# Patient Record
Sex: Female | Born: 1985 | Race: Black or African American | Hispanic: No | Marital: Single | Smoking: Never smoker
Health system: Southern US, Community
[De-identification: ages and names within clinical notes are randomized; demographics above are authoritative.]

## PROBLEM LIST (undated history)

## (undated) DIAGNOSIS — IMO0002 Reserved for concepts with insufficient information to code with codable children: Secondary | ICD-10-CM

## (undated) DIAGNOSIS — E559 Vitamin D deficiency, unspecified: Secondary | ICD-10-CM

## (undated) DIAGNOSIS — S0300XA Dislocation of jaw, unspecified side, initial encounter: Secondary | ICD-10-CM

## (undated) DIAGNOSIS — A630 Anogenital (venereal) warts: Secondary | ICD-10-CM

## (undated) DIAGNOSIS — G43909 Migraine, unspecified, not intractable, without status migrainosus: Secondary | ICD-10-CM

## (undated) HISTORY — DX: Migraine, unspecified, not intractable, without status migrainosus: G43.909

## (undated) HISTORY — DX: Reserved for concepts with insufficient information to code with codable children: IMO0002

## (undated) HISTORY — DX: Vitamin D deficiency, unspecified: E55.9

## (undated) HISTORY — DX: Anogenital (venereal) warts: A63.0

## (undated) HISTORY — PX: OTHER SURGICAL HISTORY: SHX169

## (undated) HISTORY — DX: Dislocation of jaw, unspecified side, initial encounter: S03.00XA

---
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
## 2006-02-16 ENCOUNTER — Emergency Department: Payer: Self-pay | Admitting: Emergency Medicine

## 2006-02-17 ENCOUNTER — Other Ambulatory Visit: Payer: Self-pay

## 2006-07-01 IMAGING — CR DG HAND COMPLETE 3+V*L*
1 series · 3 of 3 positions shown · non-contrast
Comparison: none

REASON FOR EXAM: Pain
COMMENTS:

PROCEDURE:     DXR - DXR HAND LT COMPLETE  W/OBLIQUES  - September 30, 2004 [DATE]
RESULT:     Multiple views of the LEFT hand show no evidence of fracture,
foreign body or soft tissue swelling.

[Series 1: view not recorded · 0.17mm/px · 3 of 3 slices shown]
[im 1/3]
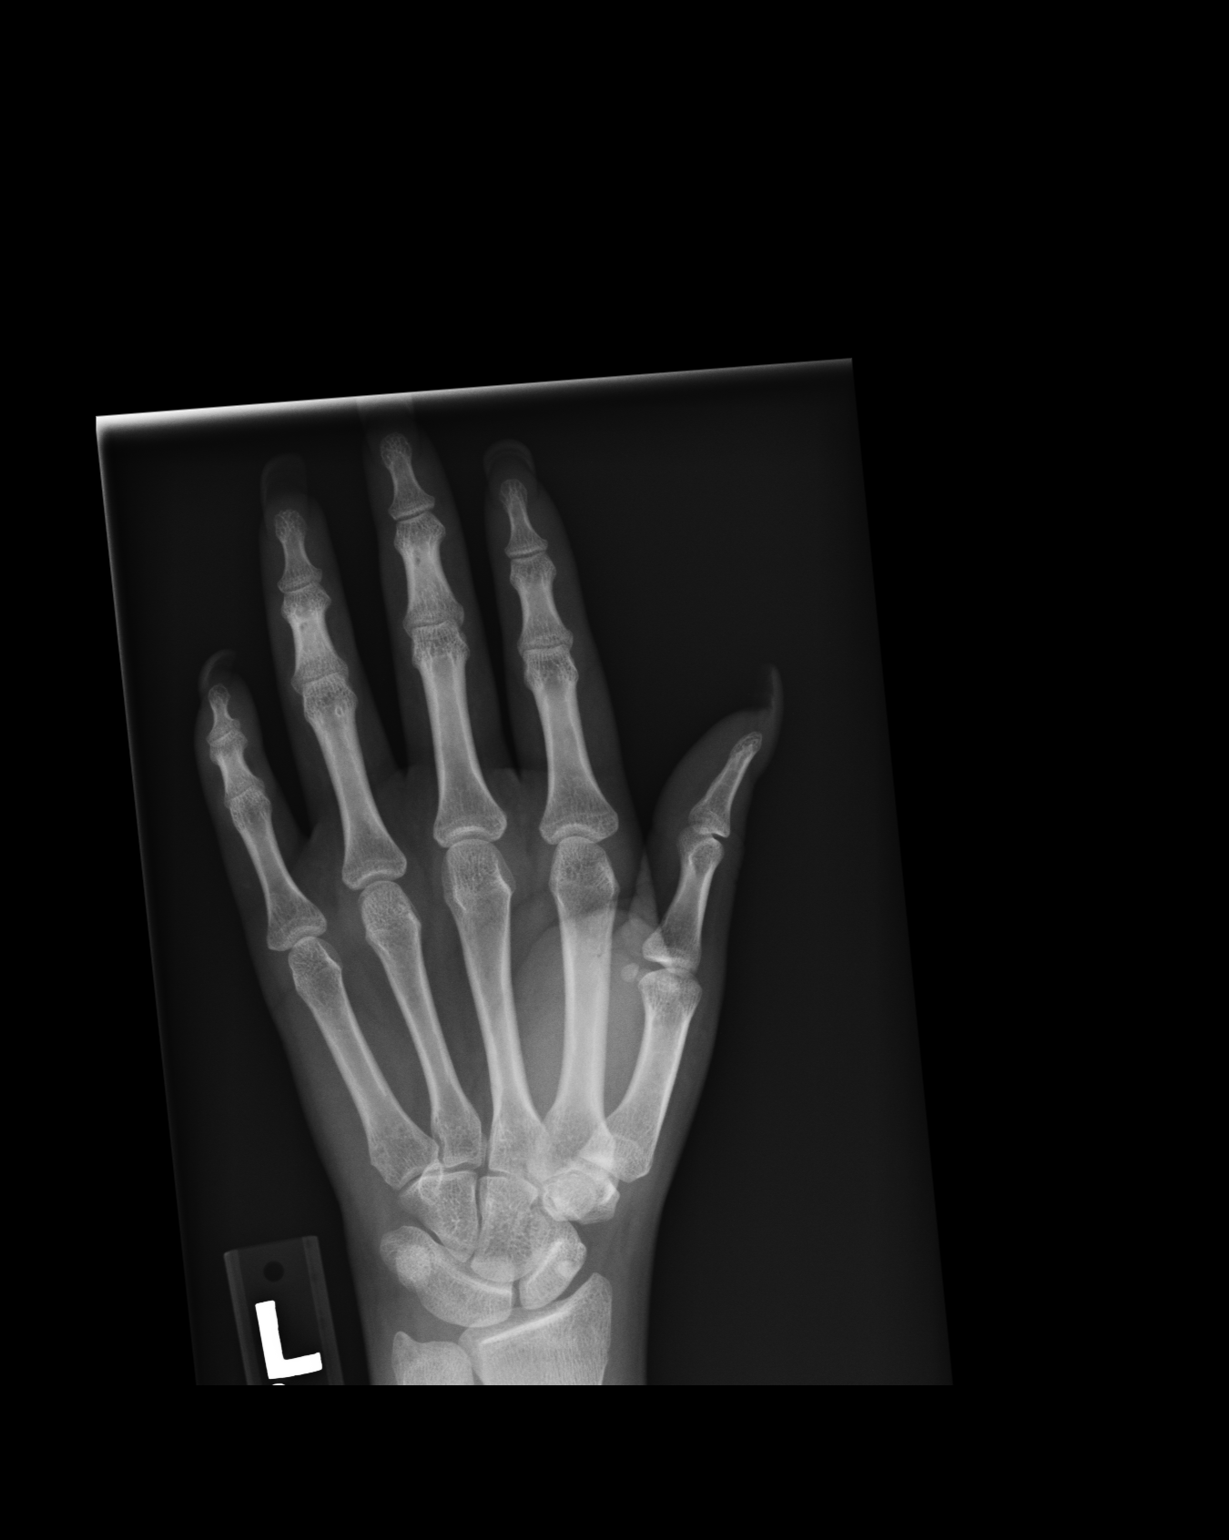
[im 2/3]
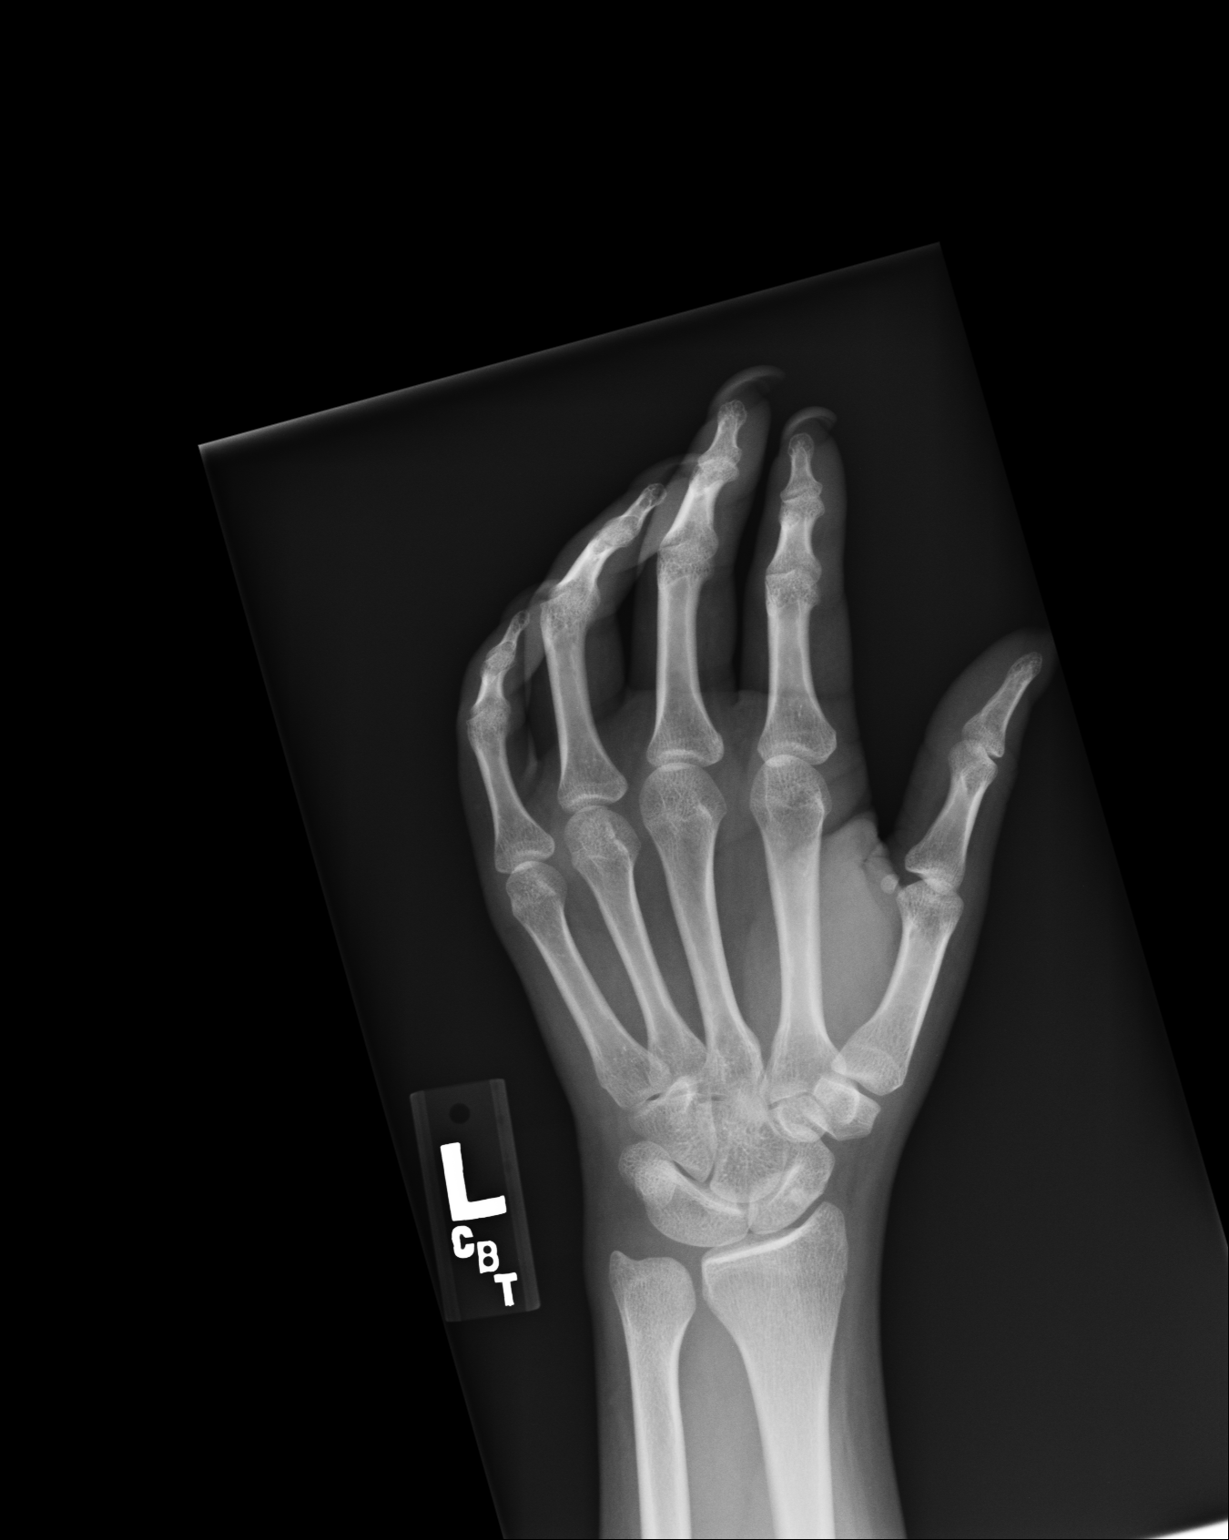
[im 3/3]
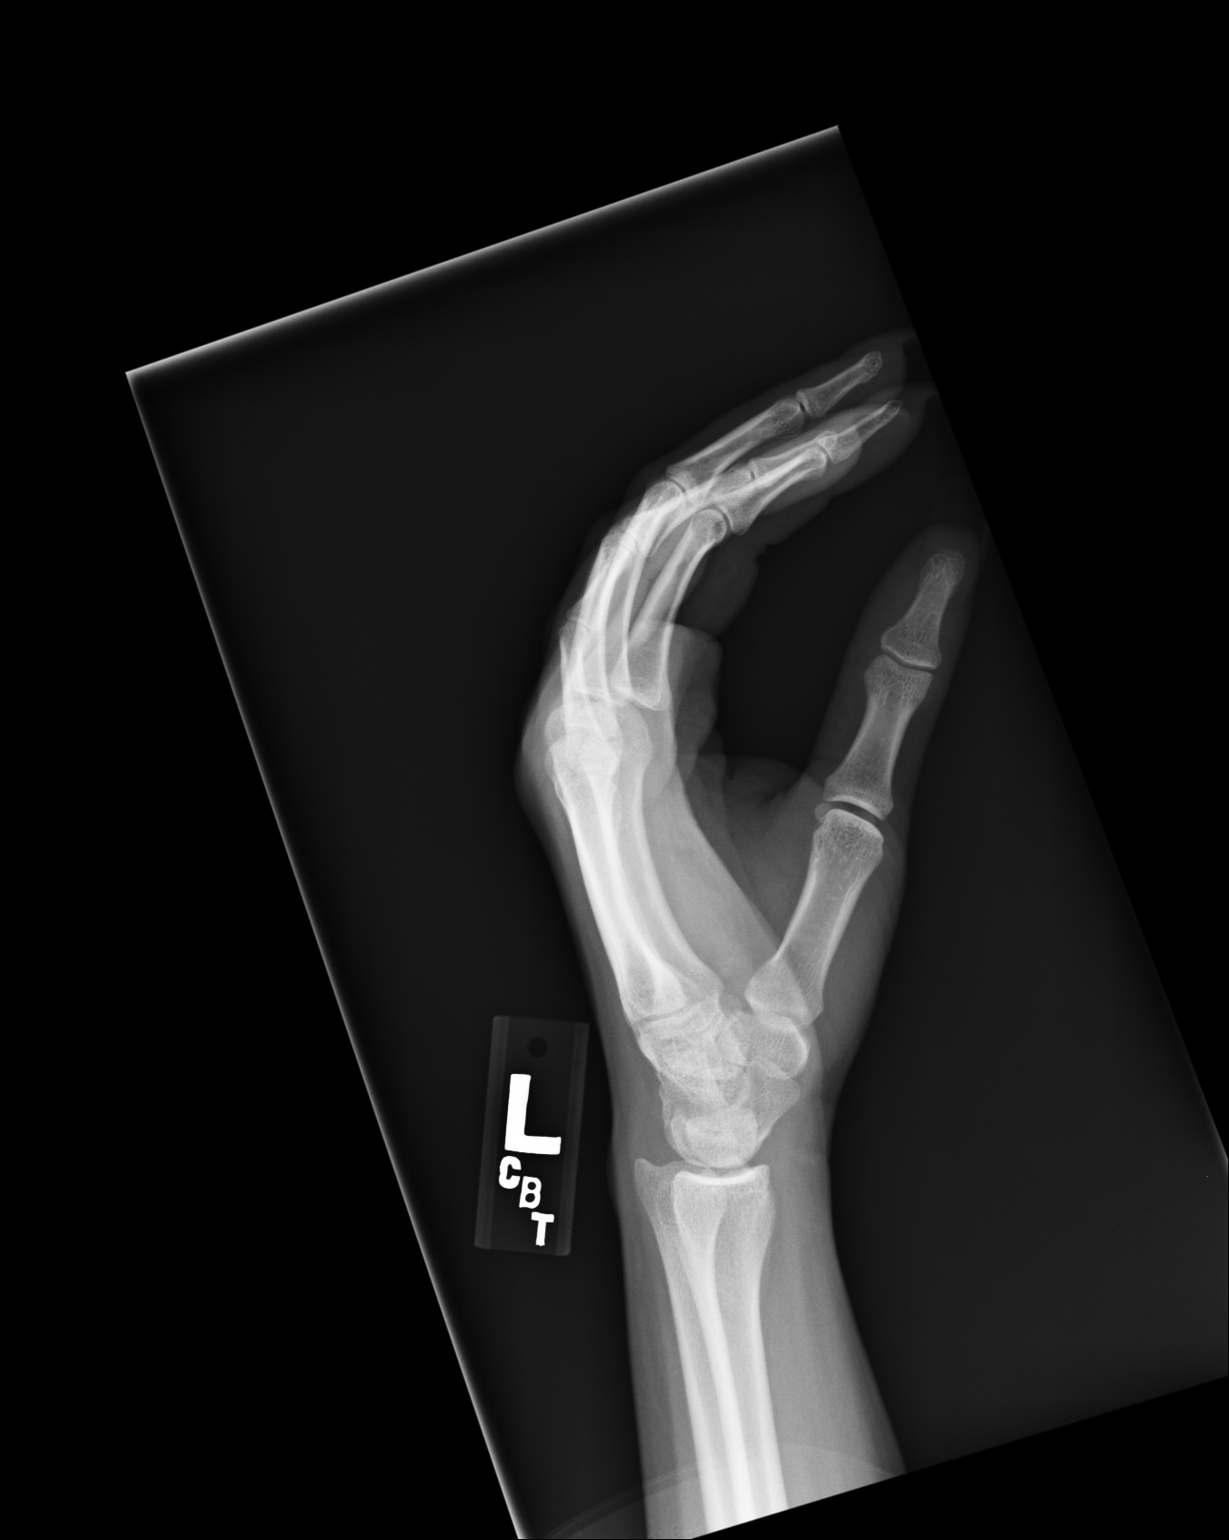

[3 of 3 positions shown; findings below may reference images not displayed]

IMPRESSION: Normal LEFT hand.

## 2007-10-20 ENCOUNTER — Emergency Department: Payer: Self-pay | Admitting: Emergency Medicine

## 2007-10-20 ENCOUNTER — Other Ambulatory Visit: Payer: Self-pay

## 2009-07-14 ENCOUNTER — Ambulatory Visit: Payer: Self-pay | Admitting: Family Medicine

## 2009-07-24 ENCOUNTER — Ambulatory Visit: Payer: Self-pay | Admitting: Psychiatry

## 2011-04-14 IMAGING — CT CT HEAD WITHOUT AND WITH CONTRAST
1 of 2 series · 13 of 30 positions shown, 17 images · non-contrast
Comparison: none

REASON FOR EXAM: rule out anerysm l side weakness ha
COMMENTS:

[Series 2: soft tissue wo · axial · 0.39mm/px · z∈[+8,+123]mm · 13 of 27 slices shown, 17 images]
[im 2/27  brain]
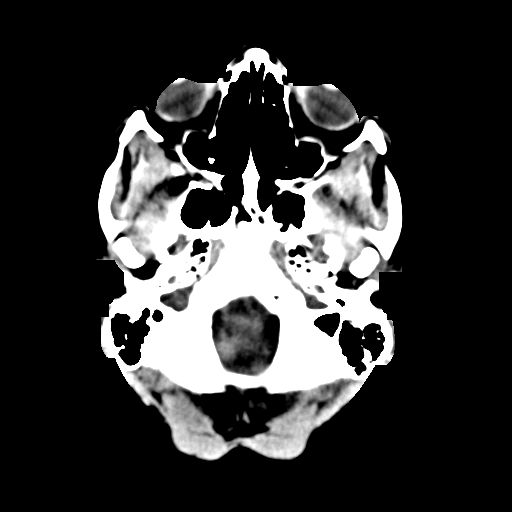
[im 2/27  bone]
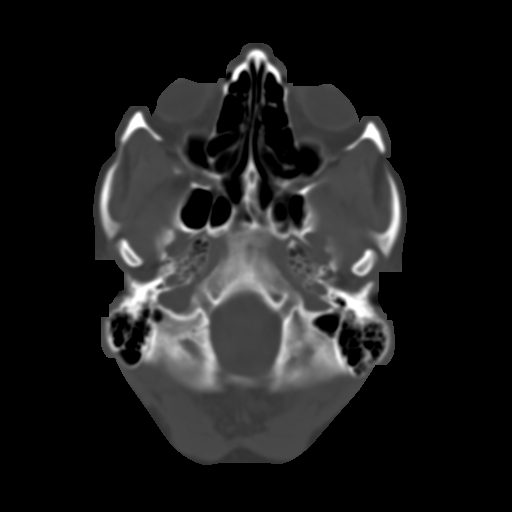
[im 4/27  brain]
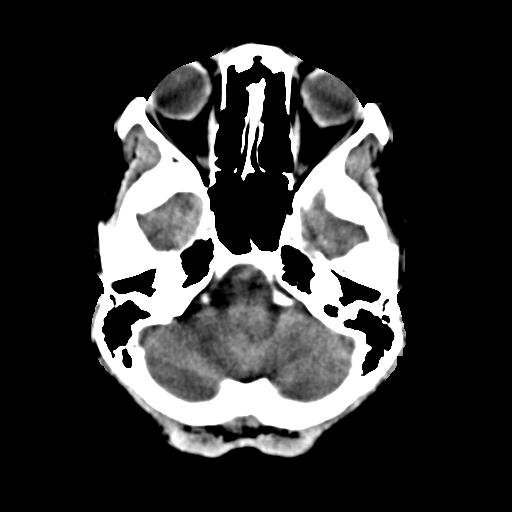
[im 6/27  brain]
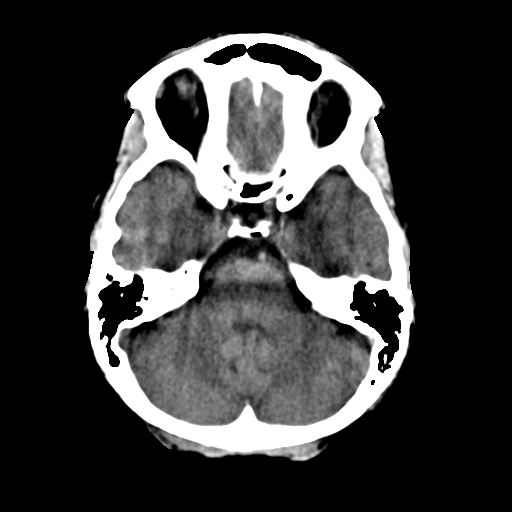
[im 8/27  brain]
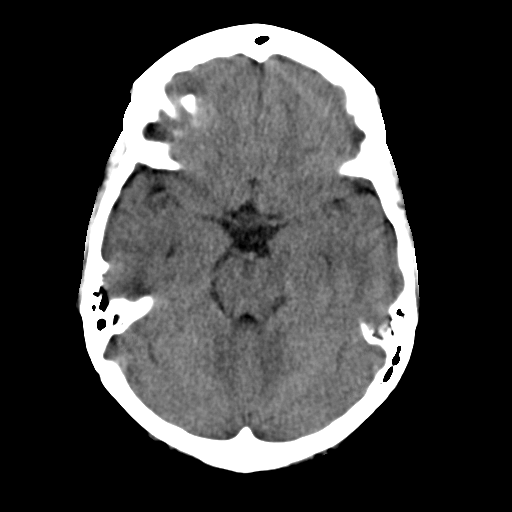
[im 10/27  brain]
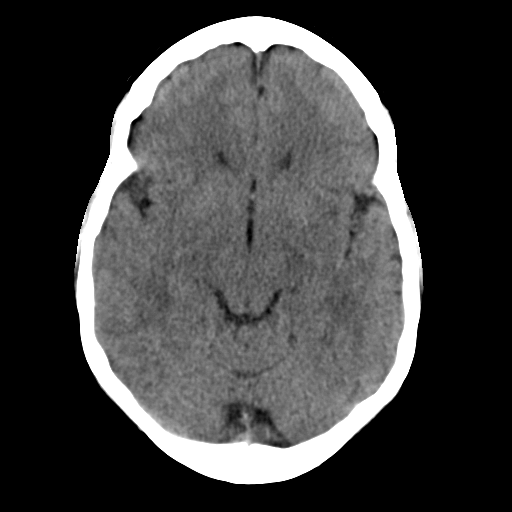
[im 10/27  bone]
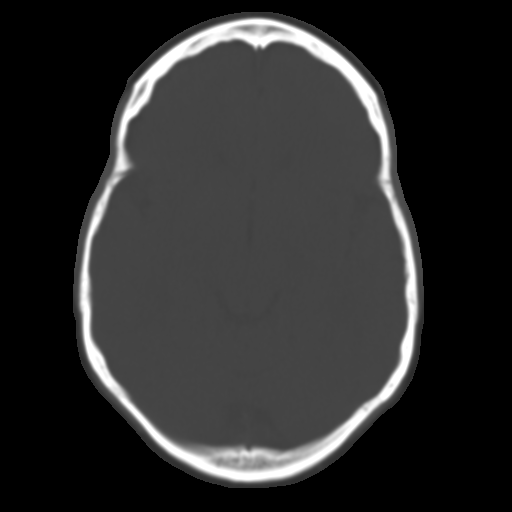
[im 12/27  brain]
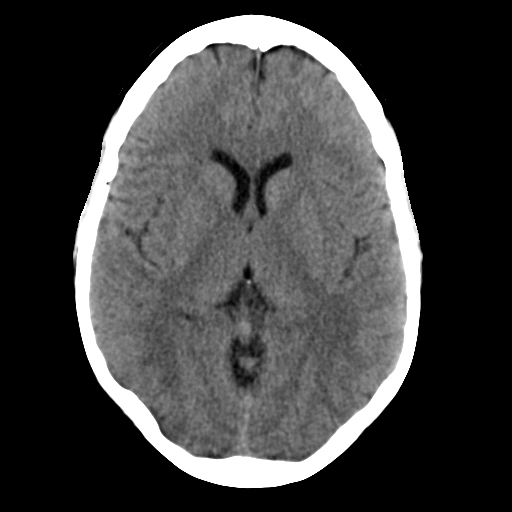
[im 14/27  brain]
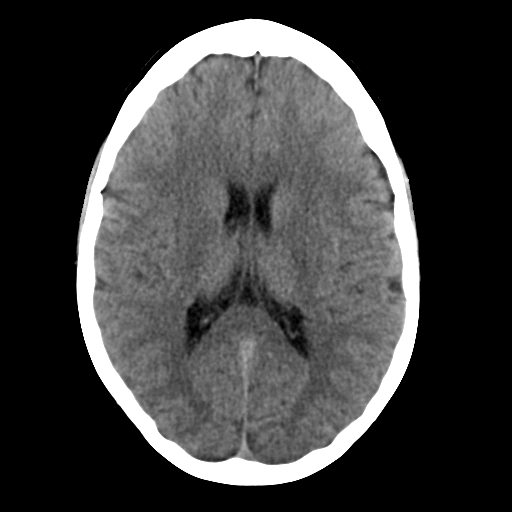
[im 15/27  brain]
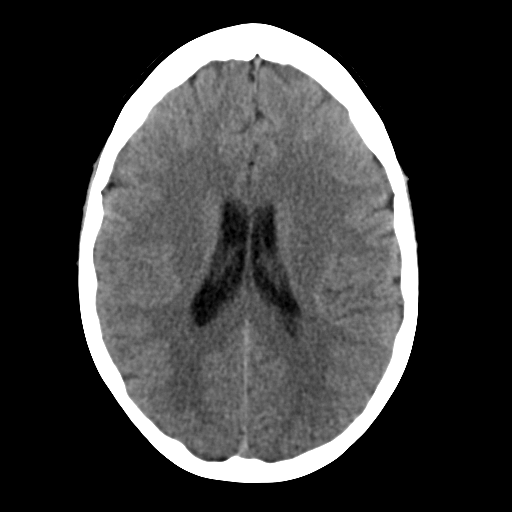
[im 17/27  brain]
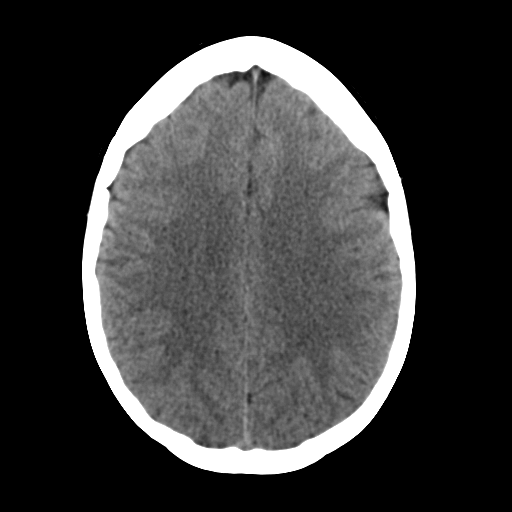
[im 17/27  bone]
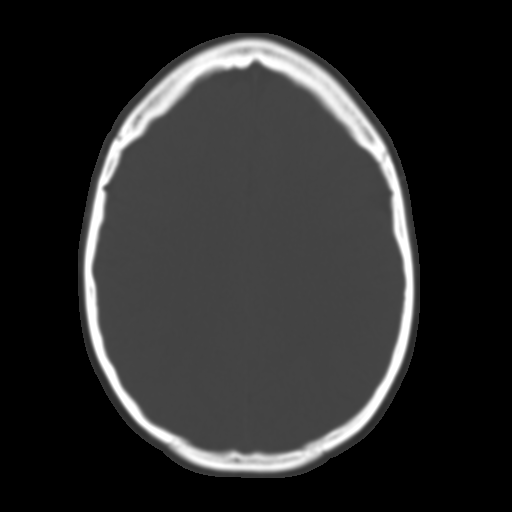
[im 19/27  brain]
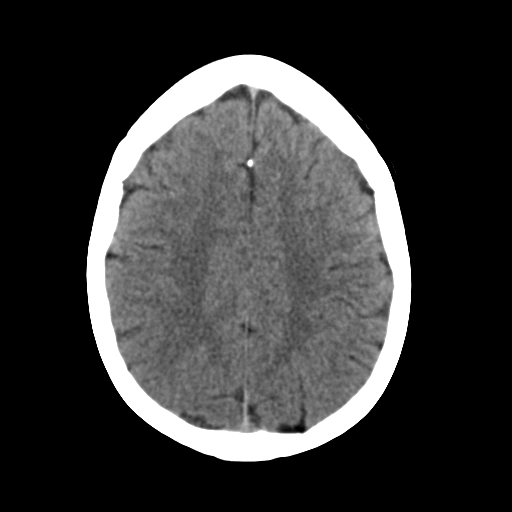
[im 21/27  brain]
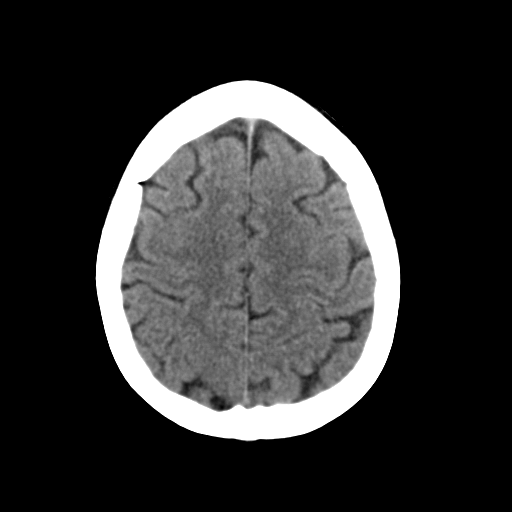
[im 23/27  brain]
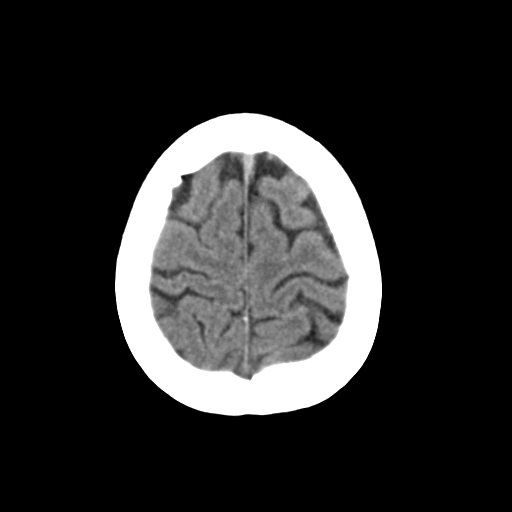
[im 25/27  brain]
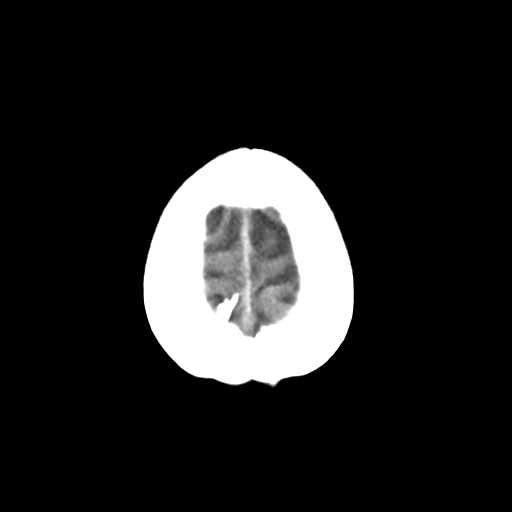
[im 25/27  bone]
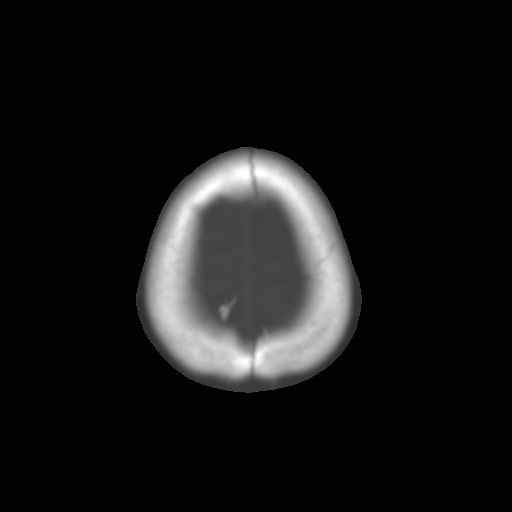

[13 of 30 positions shown; findings below may reference images not displayed]

PROCEDURE:     MYKLAND - BEYKER FRANCISCO YAGCHA/MEKHI  - July 14, 2009 [DATE]

RESULT:     Axial noncontrast and contrast enhanced CT scanning was
performed through the brain at 5 mm intervals and slice thicknesses.

The noncontrast images reveal the ventricles to be normal in size and
position. There is no intracranial mass effect. There is no evidence of an
intracranial hemorrhage nor of an evolving ischemic infarction. The
cerebellum and brainstem are normal in density. Following contrast
administration the enhancement pattern of the brain parenchyma is normal.
The visualized portions of the intracranial vasculature are grossly normal.
At bone window settings the observed portions of the paranasal sinuses and
mastoid air cells are clear. There is no evidence of an acute skull fracture.
IMPRESSION: 1. I do not see evidence of hydrocephalus nor of an intracranial mass.
2. There is no evidence of an acute or old ischemic or hemorrhagic event.
3. I do not see abnormal enhancement of the brain parenchyma. Followup brain
MRI may be useful if the patient's headaches and neurologic symptoms remain
unexplained. An MRA or CT angiogram of the brain will be needed if exclusion
of the possibility of an intracranial aneurysm is needed.
4. On the lowermost image of the study along the anterior surface of the
posterior nasopharyngeal soft tissue on the right there is an approximately
7 mm diameter hypodense structure. This likely reflects a cyst. It is more
lateral than a typical Thornwaldt cyst however. Direct visualization is
recommended.

## 2012-10-31 ENCOUNTER — Other Ambulatory Visit: Payer: Self-pay | Admitting: Obstetrics and Gynecology

## 2012-11-20 ENCOUNTER — Other Ambulatory Visit: Payer: Self-pay | Admitting: Obstetrics and Gynecology

## 2012-11-20 LAB — GLUCOSE, 3 HOUR
Glucose 1 Hour: 230 mg/dL
Glucose 2 Hour: 196 mg/dL
Glucose 3 Hour: 191 mg/dL

## 2012-12-04 ENCOUNTER — Ambulatory Visit: Payer: Self-pay | Admitting: Obstetrics and Gynecology

## 2012-12-31 ENCOUNTER — Ambulatory Visit: Payer: Self-pay | Admitting: Obstetrics and Gynecology

## 2013-01-11 ENCOUNTER — Inpatient Hospital Stay: Payer: Self-pay

## 2013-01-12 LAB — PIH PROFILE
Calcium, Total: 8.8 mg/dL (ref 8.5–10.1)
Chloride: 107 mmol/L (ref 98–107)
EGFR (African American): >60
EGFR (Non-African Amer.): >60
Glucose: 90 mg/dL (ref 65–99)
MCH: 28.9 pg (ref 26.0–34.0)
MCHC: 33.7 g/dL (ref 32.0–36.0)
MCV: 86 fL (ref 80–100)
Platelet: 116 10*3/uL — ABNORMAL LOW (ref 150–440)
SGOT(AST): 24 U/L (ref 15–37)
Sodium: 139 mmol/L (ref 136–145)
WBC: 9.3 10*3/uL (ref 3.6–11.0)

## 2013-01-12 LAB — CBC WITH DIFFERENTIAL/PLATELET
Basophil %: 1.2 %
HCT: 32.9 % — ABNORMAL LOW (ref 35.0–47.0)
HGB: 11.2 g/dL — ABNORMAL LOW (ref 12.0–16.0)
Lymphocyte #: 1.9 10*3/uL (ref 1.0–3.6)
Lymphocyte %: 21.6 %
MCH: 29.3 pg (ref 26.0–34.0)
MCHC: 33.9 g/dL (ref 32.0–36.0)
MCV: 86 fL (ref 80–100)
Monocyte #: 0.5 x10 3/mm (ref 0.2–0.9)
Monocyte %: 5.6 %
Neutrophil #: 6.4 10*3/uL (ref 1.4–6.5)
RBC: 3.81 10*6/uL (ref 3.80–5.20)
WBC: 9 10*3/uL (ref 3.6–11.0)

## 2013-01-12 LAB — PROTEIN / CREATININE RATIO, URINE: Protein, Random Urine: 29 mg/dL — ABNORMAL HIGH (ref 0–12)

## 2013-01-13 LAB — PIH PROFILE
Calcium, Total: 7.4 mg/dL — ABNORMAL LOW (ref 8.5–10.1)
Chloride: 107 mmol/L (ref 98–107)
Co2: 23 mmol/L (ref 21–32)
Creatinine: 0.87 mg/dL (ref 0.60–1.30)
EGFR (Non-African Amer.): >60
Glucose: 89 mg/dL (ref 65–99)
HGB: 9.7 g/dL — ABNORMAL LOW (ref 12.0–16.0)
MCV: 86 fL (ref 80–100)
Osmolality: 272 (ref 275–301)
Platelet: 106 10*3/uL — ABNORMAL LOW (ref 150–440)
Potassium: 3.9 mmol/L (ref 3.5–5.1)
RDW: 15.2 % — ABNORMAL HIGH (ref 11.5–14.5)
Uric Acid: 7.2 mg/dL — ABNORMAL HIGH (ref 2.6–6.0)
WBC: 11 10*3/uL (ref 3.6–11.0)

## 2014-09-09 NOTE — H&P (Signed)
L&D Evaluation:  History:  HPI 29 yo AAF G1P0, estimated date of confinement 01/13/2013, EGA 39.6 weeks admitted for IOL due to [facility].   Patient's Medical History Sickle Trait; UTI in pregnancy; GDM, diet controilled; Increased T21 risk,  work up declined; Migraine HA; Fibroids   Patient's Surgical History none   Medications Pre Natal Vitamins  Zofran   Allergies Zithromax   Social History none   Family History Non-Contributory   Exam:  Vital Signs BP >140/90   Urine Protein 1+, Urine P:C Ratio 506   General no apparent distress   Mental Status clear   Heart normal sinus rhythm   Abdomen gravid, non-tender   Estimated Fetal Weight Average for gestational age   Back no CVAT   Edema 1+   Reflexes 3+   Pelvic no external lesions, 1/50/-3/vtx/BOWI   Mebranes Intact   FHT normal rate with no decels   Ucx irregular   Skin dry   Other AB+/Atb-0/NR/RI/VI/HB-/HIV-/ Glucola 183; 3 hr GTT 69,450,388,828/MKL-   Impression:  Impression TIUP; GDM, Diet Controlled; Pre Eclampsia; Increased Risk T21; H/O LVIEF, resolved.   Plan:  Plan IOL; Magnesium Prophylaxis; Epidural ASAP (plts 116)   Electronic Signatures: DeFrancesco, Alanda Slim (MD)  (Signed 13-Sep-14 09:29)  Authored: L&D Evaluation   Last Updated: 13-Sep-14 09:29 by DeFrancesco, Alanda Slim (MD)

## 2015-08-04 ENCOUNTER — Encounter: Payer: Self-pay | Admitting: Obstetrics and Gynecology

## 2015-08-04 ENCOUNTER — Ambulatory Visit (INDEPENDENT_AMBULATORY_CARE_PROVIDER_SITE_OTHER): Payer: Self-pay | Admitting: Obstetrics and Gynecology

## 2015-08-04 ENCOUNTER — Other Ambulatory Visit (INDEPENDENT_AMBULATORY_CARE_PROVIDER_SITE_OTHER): Payer: Self-pay

## 2015-08-04 VITALS — BP 103/68 | HR 93 | Ht 63.0 in | Wt 172.4 lb

## 2015-08-04 DIAGNOSIS — Z3490 Encounter for supervision of normal pregnancy, unspecified, unspecified trimester: Secondary | ICD-10-CM | POA: Insufficient documentation

## 2015-08-04 DIAGNOSIS — Z331 Pregnant state, incidental: Secondary | ICD-10-CM

## 2015-08-04 DIAGNOSIS — N912 Amenorrhea, unspecified: Secondary | ICD-10-CM

## 2015-08-04 DIAGNOSIS — B3731 Acute candidiasis of vulva and vagina: Secondary | ICD-10-CM | POA: Insufficient documentation

## 2015-08-04 DIAGNOSIS — A499 Bacterial infection, unspecified: Secondary | ICD-10-CM

## 2015-08-04 DIAGNOSIS — B9689 Other specified bacterial agents as the cause of diseases classified elsewhere: Secondary | ICD-10-CM | POA: Insufficient documentation

## 2015-08-04 DIAGNOSIS — N760 Acute vaginitis: Secondary | ICD-10-CM

## 2015-08-04 DIAGNOSIS — B373 Candidiasis of vulva and vagina: Secondary | ICD-10-CM

## 2015-08-04 LAB — POCT URINE PREGNANCY: PREG TEST UR: POSITIVE — AB

## 2015-08-04 MED ORDER — METRONIDAZOLE 0.75 % VA GEL: 1.0000 | Freq: Every day | VAGINAL | Status: DC

## 2015-08-04 MED ORDER — FLUCONAZOLE 150 MG PO TABS: 150.0000 mg | ORAL_TABLET | Freq: Once | ORAL | Status: DC

## 2015-08-04 NOTE — Progress Notes (Addendum)
GYN ENCOUNTER NOTE  Subjective:       Jacqueline Rogers is a 30 y.o. G43P1001 female is here for gynecologic evaluation of the following issues:  1. Amenorrhea 2. Vaginal discharge.     Patient reports several day history of vaginal discharge with burning and itching. No recent antibiotic use. Amenorrhea. Multiple home pregnancy test negative. UPT positive today  Gynecologic History Patient's last menstrual period was 04/06/2015 (exact date). Contraception: none  Obstetric History OB History  Gravida Para Term Preterm AB SAB TAB Ectopic Multiple Living  1 1 1       1     # Outcome Date GA Lbr Len/2nd Weight Sex Delivery Anes PTL Lv  1 Term 2014   5 lb 2.2 oz (2.331 kg) M Vag-Spont   Y      Past Medical History  Diagnosis Date  . TMJ (dislocation of temporomandibular joint)   . LGSIL (low grade squamous intraepithelial dysplasia)   . Migraine   . Condyloma   . Vitamin D deficiency     Past Surgical History  Procedure Laterality Date  . None      No current outpatient prescriptions on file prior to visit.   No current facility-administered medications on file prior to visit.    Allergies  Allergen Reactions  . Zithromax [Azithromycin] Rash    Social History   Social History  . Marital Status: Single    Spouse Name: N/A  . Number of Children: N/A  . Years of Education: N/A   Occupational History  . Not on file.   Social History Main Topics  . Smoking status: Never Smoker   . Smokeless tobacco: Not on file  . Alcohol Use: No  . Drug Use: No  . Sexual Activity: Yes    Birth Control/ Protection: Condom     Comment: plan b   Other Topics Concern  . Not on file   Social History Narrative  . No narrative on file    Family History  Problem Relation Age of Onset  . Breast cancer Mother   . Breast cancer Maternal Aunt   . Ovarian cancer Neg Hx   . Colon cancer Neg Hx   . Diabetes Neg Hx   . Heart disease Neg Hx     The following portions of the  patient's history were reviewed and updated as appropriate: allergies, current medications, past family history, past medical history, past social history, past surgical history and problem list.  Review of Systems Review of Systems - General ROS: negative for - chills, fatigue, fever, hot flashes, malaise or night sweats Hematological and Lymphatic ROS: negative for - bleeding problems or swollen lymph nodes Gastrointestinal ROS: negative for - abdominal pain, blood in stools, change in bowel habits and nausea/vomiting Musculoskeletal ROS: negative for - joint pain, muscle pain or muscular weakness Genito-Urinary ROS: Amenorrhea; vaginal discharge with itching  Objective:   BP 103/68 mmHg  Pulse 93  Ht 5\' 3"  (1.6 m)  Wt 172 lb 6.4 oz (78.2 kg)  BMI 30.55 kg/m2  LMP 04/06/2015 (Exact Date) CONSTITUTIONAL: Well-developed, well-nourished female in no acute distress.  HENT:  Normocephalic, atraumatic.  NECK: Normal range of motion, supple, no masses.  Normal thyroid.  SKIN: Skin is warm and dry. No rash noted. Not diaphoretic. No erythema. No pallor. [place]: Alert and oriented to person, place, and time. PSYCHIATRIC: Normal mood and affect. Normal behavior. Normal judgment and thought content. CARDIOVASCULAR:Not Examined RESPIRATORY: Not Examined BREASTS: Not Examined ABDOMEN: Soft,  non distended; Non tender.  No Organomegaly. PELVIC:  External Genitalia: Normal  BUS: Normal  Vagina: Normal; White vaginal discharge in vault  Cervix: Normal; no cervical motion tenderness  Uterus: Ten-week size, nontender, mobile  Adnexa: Normal  RV: Normal   Bladder: Nontender MUSCULOSKELETAL: Normal range of motion. No tenderness.  No cyanosis, clubbing, or edema.  PROCEDURE: Wet prep Normal saline-positive for clue cells, few white blood cells; no Trichomonas KOH-positive for hyphae     Assessment:   1. Amenorrhea - POCT urine pregnancy  2. BV (bacterial vaginosis)  3. Monilial  vaginitis  4. Pregnancy    PLAN:   1. Diflucan 150 mg orally 1 dose 2.MetroGel 1 applicator intravaginal daily 5 days 3. Ultrasound to confirm EDD and fetal viability 4. Return for OB nursing intake history.   A total of 15 minutes were spent face-to-face with the patient during this encounter and over half of that time dealt with counseling and coordination of care.  Brayton Mars, MD  Note: This dictation was prepared with Dragon dictation along with smaller phrase technology. Any transcriptional errors that result from this process are unintentional.   Addendum: Ultrasound shows 5.3 week intrauterine pregnancy with gestational sac; no fetal pole seen; left anterior fundal fibroid was noted; 2 cm left ovarian cyst present.  Patient does not desire to keep pregnancy. Options were discussed. [facility] Patient is to return here afterwards for long-term contraceptive management. Mirena IUD is recommended.  Brayton Mars, MD

## 2015-08-04 NOTE — Patient Instructions (Addendum)
1. Ultrasound to confirm EDD 2. Diflucan 150 mg orally 1 dose 3. MetroGel intravaginal daily 5 days 4. Patient to contact us for contraception appointment was desired

## 2016-07-29 ENCOUNTER — Ambulatory Visit: Payer: Self-pay | Admitting: Physician Assistant

## 2016-07-29 VITALS — BP 128/70 | HR 97 | Temp 98.8°F

## 2016-07-29 DIAGNOSIS — R0982 Postnasal drip: Secondary | ICD-10-CM

## 2016-07-29 MED ORDER — FLUTICASONE PROPIONATE 50 MCG/ACT NA SUSP: 2.0000 | Freq: Every day | NASAL | 6 refills | Status: DC

## 2016-07-29 NOTE — Progress Notes (Signed)
S: C/o sore throat and congestion for 3 days, no fever, chills, cp/sob, v/d; had ?strep a month ago, was seen at urgent care and given amoxil, got better, now throat is a little sore again Using otc meds:   O: PE: vitals wnl, nad, perrl eomi, normocephalic, tms dull, nasal mucosa red and swollen, throat injected no redness or exudate noted, neck supple no lymph, lungs c t a, cv rrr, neuro intact  A:  Acute pnd  P: drink fluids, continue regular meds , use otc meds of choice, return if not improving in 5 days, return earlier if worsening , zyrtec, sudafed, flonase

## 2016-09-09 ENCOUNTER — Ambulatory Visit: Payer: Self-pay | Admitting: Physician Assistant

## 2016-09-09 ENCOUNTER — Encounter (INDEPENDENT_AMBULATORY_CARE_PROVIDER_SITE_OTHER): Payer: Self-pay

## 2016-09-09 ENCOUNTER — Encounter: Payer: Self-pay | Admitting: Physician Assistant

## 2016-09-09 VITALS — BP 110/75 | HR 92 | Temp 98.5°F | Resp 16 | Ht 62.0 in | Wt 166.0 lb

## 2016-09-09 DIAGNOSIS — Z0000 Encounter for general adult medical examination without abnormal findings: Secondary | ICD-10-CM

## 2016-09-09 NOTE — Addendum Note (Signed)
Addended by: Rudene Anda T on: 09/09/2016 12:21 PM   Modules accepted: Orders

## 2016-09-09 NOTE — Progress Notes (Signed)
   Subjective: Physical exam    Patient ID: Jacqueline Rogers, female    DOB: [birth year], 31 y.o.   MRN: [id number]  HPI Patient present for annual physical exam.   Review of Systems    Unremarkable Objective:   Physical Exam Overweight. HEENT unremarkable. Neck supple without adenopathy. Lungs CTA and Heart RRR. No abdominal distensio, normoactive bowel sounds, soft w/o guarding upon palpation. No spinal defomity, F/E/ON, and negative straight leg test. No upper/lower extremities deformity, F/E ROM. CNII-XII grossly intact.       Assessment & Plan:Well exam  Follow up post lab.

## 2016-09-10 LAB — CMP12+LP+TP+TSH+6AC+CBC/D/PLT
A/G RATIO: 1.6 (ref 1.2–2.2)
ALT: 18 IU/L (ref 0–32)
AST: 14 IU/L (ref 0–40)
Albumin: 4 g/dL (ref 3.5–5.5)
Alkaline Phosphatase: 71 IU/L (ref 39–117)
BUN / CREAT RATIO: 8 — AB (ref 9–23)
BUN: 7 mg/dL (ref 6–20)
Basophils Absolute: 0 10*3/uL (ref 0.0–0.2)
Basos: 0 %
Bilirubin Total: 0.3 mg/dL (ref 0.0–1.2)
CHOL/HDL RATIO: 3.9 ratio (ref 0.0–4.4)
CREATININE: 0.88 mg/dL (ref 0.57–1.00)
Calcium: 9.2 mg/dL (ref 8.7–10.2)
Chloride: 105 mmol/L (ref 96–106)
Cholesterol, Total: 193 mg/dL (ref 100–199)
EOS (ABSOLUTE): 0.1 10*3/uL (ref 0.0–0.4)
EOS: 1 %
Estimated CHD Risk: 0.8 times avg. (ref 0.0–1.0)
Free Thyroxine Index: 2.3 (ref 1.2–4.9)
GFR, EST AFRICAN AMERICAN: 102 mL/min/{1.73_m2} (ref >59)
GFR, EST NON AFRICAN AMERICAN: 88 mL/min/{1.73_m2} (ref >59)
GGT: 32 IU/L (ref 0–60)
GLUCOSE: 82 mg/dL (ref 65–99)
Globulin, Total: 2.5 g/dL (ref 1.5–4.5)
HDL: 49 mg/dL (ref >39)
HEMATOCRIT: 36.9 % (ref 34.0–46.6)
HEMOGLOBIN: 12 g/dL (ref 11.1–15.9)
IMMATURE GRANS (ABS): 0 10*3/uL (ref 0.0–0.1)
Immature Granulocytes: 0 %
Iron: 78 ug/dL (ref 27–159)
LDH: 188 IU/L (ref 119–226)
LDL Calculated: 134 mg/dL — ABNORMAL HIGH (ref 0–99)
Lymphocytes Absolute: 2.9 10*3/uL (ref 0.7–3.1)
Lymphs: 29 %
MCH: 27.5 pg (ref 26.6–33.0)
MCHC: 32.5 g/dL (ref 31.5–35.7)
MCV: 84 fL (ref 79–97)
MONOCYTES: 4 %
Monocytes Absolute: 0.4 10*3/uL (ref 0.1–0.9)
NEUTROS ABS: 6.5 10*3/uL (ref 1.4–7.0)
Neutrophils: 66 %
PHOSPHORUS: 4.1 mg/dL (ref 2.5–4.5)
POTASSIUM: 4.1 mmol/L (ref 3.5–5.2)
Platelets: 314 10*3/uL (ref 150–379)
RBC: 4.37 x10E6/uL (ref 3.77–5.28)
RDW: 14.5 % (ref 12.3–15.4)
SODIUM: 139 mmol/L (ref 134–144)
T3 Uptake Ratio: 29 % (ref 24–39)
T4, Total: 7.8 ug/dL (ref 4.5–12.0)
TSH: 1.69 u[IU]/mL (ref 0.450–4.500)
Total Protein: 6.5 g/dL (ref 6.0–8.5)
Triglycerides: 52 mg/dL (ref 0–149)
URIC ACID: 4.9 mg/dL (ref 2.5–7.1)
VLDL CHOLESTEROL CAL: 10 mg/dL (ref 5–40)
WBC: 10 10*3/uL (ref 3.4–10.8)

## 2016-09-10 LAB — VITAMIN D 25 HYDROXY (VIT D DEFICIENCY, FRACTURES): VIT D 25 HYDROXY: 37.2 ng/mL (ref 30.0–100.0)

## 2017-01-05 ENCOUNTER — Ambulatory Visit (INDEPENDENT_AMBULATORY_CARE_PROVIDER_SITE_OTHER): Payer: Managed Care, Other (non HMO) | Admitting: Obstetrics and Gynecology

## 2017-01-05 ENCOUNTER — Encounter: Payer: Self-pay | Admitting: Obstetrics and Gynecology

## 2017-01-05 VITALS — BP 138/93 | HR 120 | Ht 62.0 in | Wt 166.1 lb

## 2017-01-05 DIAGNOSIS — B373 Candidiasis of vulva and vagina: Secondary | ICD-10-CM

## 2017-01-05 DIAGNOSIS — Z9289 Personal history of other medical treatment: Secondary | ICD-10-CM | POA: Diagnosis not present

## 2017-01-05 DIAGNOSIS — Z30011 Encounter for initial prescription of contraceptive pills: Secondary | ICD-10-CM

## 2017-01-05 DIAGNOSIS — B3731 Acute candidiasis of vulva and vagina: Secondary | ICD-10-CM

## 2017-01-05 MED ORDER — NORETHIN-ETH ESTRAD-FE BIPHAS 1 MG-10 MCG / 10 MCG PO TABS: 1.0000 | ORAL_TABLET | Freq: Every day | ORAL | 11 refills | Status: DC

## 2017-01-05 MED ORDER — TERCONAZOLE 80 MG VA SUPP: 80.0000 mg | Freq: Every day | VAGINAL | 0 refills | Status: DC

## 2017-01-05 NOTE — Patient Instructions (Signed)
1. Begin lo Loestrin Fe oral contraceptive with next cycle 2. Terazol 3 suppositories daily for 3 days 3. Follow-up as needed 4. Nu swab plus testing is done for STDs

## 2017-01-05 NOTE — Progress Notes (Signed)
Chief complaint: 1. Vaginal discharge and irritation  Two week history of vaginal irritation with discharge. She has noted significant itching at the introitus on the left side. She self treated with Monistat approximately 1 week ago. She has not been on any recent antibiotics. She is still with the same partner and there is no history of STDs. No pelvic pain. No changes in bowel or bladder function. Patient is using condoms for contraception at this time. She has had 2 therapeutic abortions in the past 2 years for undesired pregnancies. She is willing to go on trial of very low dose oral contraceptives.  Past medical history, past surgical history, problem list, medications, and allergies are reviewed  OBJECTIVE: BP (!) 138/93   Pulse (!) 120   Ht 5\' 2"  (1.575 m)   Wt 166 lb 1.6 oz (75.3 kg)   LMP 12/25/2016 (Exact Date)   BMI 30.38 kg/m  Pleasant well-appearing female in no acute distress. Alert and oriented. Abdomen: Soft, nontender Pelvic exam: External genitalia normal BUS-normal Vagina-normal in appearance with thin white secretions present Cervix-no lesions; no discharge Bimanual-not performed  PROCEDURE: Wet prep KOH-negative for yeast Normal saline-few white blood cells; no clue cells; no Trichomonas  ASSESSMENT: 1. Suspected Monilia vaginitis, partially treated 2. Contraception, suboptimal, with history of 2 therapeutic abortions for undesired pregnancies the past 2 years  PLAN: 1. Terazol 3 suppositories 2. Trial of low Loestrin Fe oral contraceptives 3. Wet prep is noted 4. Nu swab plus 5.Return as needed  A total of 15 minutes were spent face-to-face with the patient during this encounter and over half of that time dealt with counseling and coordination of care.  Brayton Mars, MD  Note: This dictation was prepared with Dragon dictation along with smaller phrase technology. Any transcriptional errors that result from this process are  unintentional.

## 2017-01-07 LAB — NUSWAB VAGINITIS PLUS (VG+)
CANDIDA ALBICANS, NAA: NEGATIVE
CHLAMYDIA TRACHOMATIS, NAA: NEGATIVE
Candida glabrata, NAA: NEGATIVE
NEISSERIA GONORRHOEAE, NAA: NEGATIVE
TRICH VAG BY NAA: NEGATIVE

## 2017-06-09 ENCOUNTER — Encounter: Payer: Self-pay | Admitting: Physician Assistant

## 2017-06-09 ENCOUNTER — Ambulatory Visit: Payer: Self-pay | Admitting: Physician Assistant

## 2017-06-09 VITALS — BP 111/71 | HR 91 | Temp 99.1°F | Resp 16

## 2017-06-09 DIAGNOSIS — R109 Unspecified abdominal pain: Secondary | ICD-10-CM

## 2017-06-09 DIAGNOSIS — R05 Cough: Secondary | ICD-10-CM

## 2017-06-09 DIAGNOSIS — R140 Abdominal distension (gaseous): Secondary | ICD-10-CM

## 2017-06-09 DIAGNOSIS — R0981 Nasal congestion: Principal | ICD-10-CM

## 2017-06-09 MED ORDER — DICYCLOMINE HCL 20 MG PO TABS: 20.0000 mg | ORAL_TABLET | Freq: Four times a day (QID) | ORAL | 0 refills | Status: DC

## 2017-06-09 MED ORDER — FEXOFENADINE-PSEUDOEPHED ER 60-120 MG PO TB12: 1.0000 | ORAL_TABLET | Freq: Two times a day (BID) | ORAL | 0 refills | Status: DC

## 2017-06-09 MED ORDER — ONDANSETRON HCL 8 MG PO TABS: 8.0000 mg | ORAL_TABLET | Freq: Three times a day (TID) | ORAL | 0 refills | Status: DC | PRN

## 2017-06-09 NOTE — Progress Notes (Signed)
   Subjective: Stomach pain and nausea    Patient ID: Jacqueline Rogers, female    DOB: 05-11-1985, 32 y.o.   MRN: [id number]  HPI    Review of Systems     Objective:   Physical Exam        Assessment & Plan:

## 2017-06-09 NOTE — Progress Notes (Signed)
   Subjective: Sinus congestion and stomach cramps    Patient ID: Jacqueline Rogers, female    DOB: 04-Oct-1985, 32 y.o.   MRN: [id number]  HPI Patient complain of nasal congestion, bilateral ear pressure and postnasal drainage for 4 days.  Patient also complained of stomach cramping and nausea.  Patient denies diarrhea.  No palates measured for complaint.   Review of Systems Negative except for complaint    Objective:   Physical Exam HEENT remarkable for bilateral maxillary guarding and edematous TMs.  Edematous nasal turbinates clear rhinorrhea.  Postnasal drainage.  Neck is supple without adenopathy.  Heart is regular rate and rhythm.  Abdomen is slightly obese.  Decreased bowel sounds left lower quadrant.  Soft nontender palpation.       Assessment & Plan: Sinus congestion and stomach cramps  Patient given discharge care instruction.  Patient given prescription for fexofenadine, Zofran, and Bentyl.  Patient advised to follow-up 3-5 days if no improvement.

## 2017-06-12 ENCOUNTER — Ambulatory Visit: Payer: Self-pay | Admitting: Family Medicine

## 2017-06-12 VITALS — BP 119/79 | HR 105 | Temp 97.6°F | Resp 18

## 2017-06-12 DIAGNOSIS — R1032 Left lower quadrant pain: Secondary | ICD-10-CM

## 2017-06-12 DIAGNOSIS — K5903 Drug induced constipation: Secondary | ICD-10-CM

## 2017-06-12 NOTE — Progress Notes (Signed)
Patient ID: ROWEN HUR, female    DOB: 01/03/1986  Age: 32 y.o. MRN: [id number]  Chief Complaint  Patient presents with  . 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  She was here last week with some GI complaints that are documented in her note.  She was treated with dicyclomine.  She has not had a bowel movement for 3 days.  She hurts in the left lower quadrant a little bit, but is better than she was.  She has not had to use any medicine for nausea.  Current allergies, medications, problem list, past/family and social histories reviewed.  Objective:  BP 119/79   Pulse (!) 105   Temp 97.6 F (36.4 C) (Oral)   Resp 18   SpO2 99%   Afebrile.  Chest clear.  Heart regular.  Bowel sounds minimal but symmetrical and normal.  Soft.  No organomegaly or masses.  Minimal tenderness in the lateral sigmoid region of the left lower quadrant.  Assessment & Plan:   Assessment: 1. LLQ pain   2. Drug-induced constipation       Plan: I think with her bowels move her symptoms will be relieved now.  No orders of the defined types were placed in this encounter.   No orders of the defined types were placed in this encounter.        Patient Instructions  I think the dicyclomine has slowed your bowel down to much and you are a little constipated from that.  Take MiraLAX once or twice daily until you have a good BM.  (Milk of magnesia, Dulcolax, or other laxatives can be used also, but I feel like MiraLAX is always a good safe laxative)  If increasing abdominal pain, vomiting, fevers, passing of blood, etc. please get rechecked.    Return if symptoms worsen or fail to improve.   Jesse Nosbisch, MD 06/12/2017

## 2017-06-12 NOTE — Patient Instructions (Signed)
I think the dicyclomine has slowed your bowel down to much and you are a little constipated from that.  Take MiraLAX once or twice daily until you have a good BM.  (Milk of magnesia, Dulcolax, or other laxatives can be used also, but I feel like MiraLAX is always a good safe laxative)  If increasing abdominal pain, vomiting, fevers, passing of blood, etc. please get rechecked.

## 2017-07-06 ENCOUNTER — Encounter: Payer: Self-pay | Admitting: Obstetrics and Gynecology

## 2017-07-06 ENCOUNTER — Ambulatory Visit (INDEPENDENT_AMBULATORY_CARE_PROVIDER_SITE_OTHER): Payer: Managed Care, Other (non HMO) | Admitting: Obstetrics and Gynecology

## 2017-07-06 VITALS — BP 114/77 | HR 102 | Ht 62.0 in | Wt 170.5 lb

## 2017-07-06 DIAGNOSIS — Z803 Family history of malignant neoplasm of breast: Secondary | ICD-10-CM | POA: Diagnosis not present

## 2017-07-06 DIAGNOSIS — N62 Hypertrophy of breast: Secondary | ICD-10-CM

## 2017-07-06 DIAGNOSIS — R638 Other symptoms and signs concerning food and fluid intake: Secondary | ICD-10-CM | POA: Diagnosis not present

## 2017-07-06 DIAGNOSIS — Z01419 Encounter for gynecological examination (general) (routine) without abnormal findings: Secondary | ICD-10-CM | POA: Diagnosis not present

## 2017-07-06 NOTE — Progress Notes (Signed)
ANNUAL PREVENTATIVE CARE GYN  ENCOUNTER NOTE  Subjective:       Jacqueline Rogers is a 32 y.o. G43P1021 female here for a routine annual gynecologic exam.  Current complaints: 1.  Wants breast reduction-significant issues with breast; upper back pain, musculoskeletal spasm, some bruising with bra usage.  Patient has mom diagnosed at age 33 with stage IV breast cancer.  2. Weight loss-patient is a sense that with some weight reduction she notices no loss in breast mass, and she is becoming more symptomatic with breast issues. 3.  History of uterine fibroids; menses are regular and not significantly painful  Bowel function and bladder function are normal.    Gynecologic History Patient's last menstrual period was 06/15/2017 (exact date). Contraception: abstinence Last Pap: 2014 wnl. Results were: normal Last mammogram: n/a. Family history of breast cancer-mom  Obstetric History OB History  Gravida Para Term Preterm AB Living  3 1 1   2 1   SAB TAB Ectopic Multiple Live Births    2     1    # Outcome Date GA Lbr Len/2nd Weight Sex Delivery Anes PTL Lv  3 TAB 2018          2 TAB 2017          1 Term 2014   5 lb 2.2 oz (2.331 kg) M Vag-Spont   LIV      Past Medical History:  Diagnosis Date  . Condyloma   . LGSIL (low grade squamous intraepithelial dysplasia)   . Migraine   . TMJ (dislocation of temporomandibular joint)   . Vitamin D deficiency     Past Surgical History:  Procedure Laterality Date  . none      No current outpatient medications on file prior to visit.   No current facility-administered medications on file prior to visit.     Allergies  Allergen Reactions  . Zithromax [Azithromycin] Rash    Social History   Socioeconomic History  . Marital status: Single    Spouse name: Not on file  . Number of children: Not on file  . Years of education: Not on file  . Highest education level: Not on file  Social Needs  . Financial resource strain: Not on file   . Food insecurity - worry: Not on file  . Food insecurity - inability: Not on file  . Transportation needs - medical: Not on file  . Transportation needs - non-medical: Not on file  Occupational History  . Not on file  Tobacco Use  . Smoking status: Never Smoker  . Smokeless tobacco: Never Used  Substance and Sexual Activity  . Alcohol use: No  . Drug use: No  . Sexual activity: Yes    Birth control/protection: Condom    Comment: plan b  Other Topics Concern  . Not on file  Social History Narrative  . Not on file    Family History  Problem Relation Age of Onset  . Breast cancer Mother   . Breast cancer Maternal Aunt   . Ovarian cancer Neg Hx   . Colon cancer Neg Hx   . Diabetes Neg Hx   . Heart disease Neg Hx     The following portions of the patient's history were reviewed and updated as appropriate: allergies, current medications, past family history, past medical history, past social history, past surgical history and problem list.  Review of Systems Review of Systems  Constitutional: Negative.   HENT: Negative.   Eyes: Negative.  Respiratory: Negative.   Cardiovascular: Negative.   Gastrointestinal: Negative.   Genitourinary: Negative.   Musculoskeletal: Positive for back pain.       Upper back pain and spasm, likely related to macromastia Patient has resorted to wearing sports bras in an attempt to help with discomfort Multiple bras have been effective in controlling symptoms and she has even developed bruising from prior use  Skin: Negative.   Neurological: Negative.   Endo/Heme/Allergies: Negative.   Psychiatric/Behavioral: Negative.       Objective:   BP 114/77   Pulse (!) 102   Ht 5\' 2"  (1.575 m)   Wt 170 lb 8 oz (77.3 kg)   LMP 06/15/2017 (Exact Date)   BMI 31.18 kg/m  CONSTITUTIONAL: Well-developed, well-nourished female in no acute distress.  PSYCHIATRIC: Normal mood and affect. Normal behavior. Normal judgment and thought  content. [place]: Alert and oriented to person, place, and time. Normal muscle tone coordination. No cranial nerve deficit noted. HENT:  Normocephalic, atraumatic, External right and left ear normal. Oropharynx is clear and moist EYES: Conjunctivae and EOM are normal. No scleral icterus.  NECK: Normal range of motion, supple, no masses.  Normal thyroid.  SKIN: Skin is warm and dry. No rash noted. Not diaphoretic. No erythema. No pallor. CARDIOVASCULAR: Normal heart rate noted, regular rhythm, no murmur. RESPIRATORY: Clear to auscultation bilaterally. Effort and breath sounds normal, no problems with respiration noted. BREASTS: Symmetric in size, large. No masses, skin changes, nipple drainage, or lymphadenopathy. ABDOMEN: Soft, normal bowel sounds, no distention noted.  No tenderness, rebound or guarding.  BLADDER: Normal PELVIC:  External Genitalia: Normal  BUS: Normal  Vagina: Normal estrogen effect; no discharge  Cervix: Normal; no lesions; no discharge; no cervical motion tenderness  Uterus: Normal; midplane, slightly enlarged 8-10 weeks size with left-sided prominence, nontender, mobile  Adnexa: Normal; nonpalpable nontender  RV: External Exam NormaI  MUSCULOSKELETAL: Normal range of motion. No tenderness.  No cyanosis, clubbing, or edema.  2+ distal pulses. LYMPHATIC: No Axillary, Supraclavicular, or Inguinal Adenopathy.    Assessment:   Annual gynecologic examination 32 y.o. Contraception: abstinence [id number] Family history of breast cancer  Plan:  Pap: Pap Co Test Mammogram: Not Indicated Stool Guaiac Testing:  Not Indicated Labs: fbs lipid a1c tsh Routine preventative health maintenance measures emphasized: Exercise/Diet/Weight control, Tobacco Warnings, Alcohol/Substance use risks and Safe Sex Genetic testing for breast/ovarian/colon cancer recommended; literature given Referral to breast surgeon for consideration of reduction mammoplasty Return to [place], CMA  Brayton Mars, MD  Note: This dictation was prepared with Dragon dictation along with smaller phrase technology. Any transcriptional errors that result from this process are unintentional.

## 2017-07-06 NOTE — Patient Instructions (Addendum)
1.  Pap smear is done 2.  Self breast awareness is encouraged 3.  Screening labs are obtained 4.  Continue with healthy eating and exercise with control weight loss 5.  Contraception-abstinence 6.  Genetic testing for breast cancer/ovarian cancer/colon cancer recommended 7.  Referral to breast surgeon for consideration of reduction mammoplasty-Dr. Bonnita Nasuti, [facility] 6.  Return in 1 year for annual physical  Health Maintenance, Female Adopting a healthy lifestyle and getting preventive care can go a long way to promote health and wellness. Talk with your health care provider about what schedule of regular examinations is right for you. This is a good chance for you to check in with your provider about disease prevention and staying healthy. In between checkups, there are plenty of things you can do on your own. Experts have done a lot of research about which lifestyle changes and preventive measures are most likely to keep you healthy. Ask your health care provider for more information. Weight and diet Eat a healthy diet  Be sure to include plenty of vegetables, fruits, low-fat dairy products, and lean protein.  Do not eat a lot of foods high in solid fats, added sugars, or salt.  Get regular exercise. This is one of the most important things you can do for your health. ? Most adults should exercise for at least 150 minutes each week. The exercise should increase your heart rate and make you sweat (moderate-intensity exercise). ? Most adults should also do strengthening exercises at least twice a week. This is in addition to the moderate-intensity exercise.  Maintain a healthy weight  Body mass index (BMI) is a measurement that can be used to identify possible weight problems. It estimates body fat based on height and weight. Your health care provider can help determine your BMI and help you achieve or maintain a healthy weight.  For females 110 years of age and older: ? A BMI below 18.5  is considered underweight. ? A BMI of 18.5 to 24.9 is normal. ? A BMI of 25 to 29.9 is considered overweight. ? A BMI of 30 and above is considered obese.  Watch levels of cholesterol and blood lipids  You should start having your blood tested for lipids and cholesterol at 32 years of age, then have this test every 5 years.  You may need to have your cholesterol levels checked more often if: ? Your lipid or cholesterol levels are high. ? You are older than 33 years of age. ? You are at high risk for heart disease.  Cancer screening Lung Cancer  Lung cancer screening is recommended for adults 58-14 years old who are at high risk for lung cancer because of a history of smoking.  A yearly low-dose CT scan of the lungs is recommended for people who: ? Currently smoke. ? Have quit within the past 15 years. ? Have at least a 30-pack-year history of smoking. A pack year is smoking an average of one pack of cigarettes a day for 1 year.  Yearly screening should continue until it has been 15 years since you quit.  Yearly screening should stop if you develop a health problem that would prevent you from having lung cancer treatment.  Breast Cancer  Practice breast self-awareness. This means understanding how your breasts normally appear and feel.  It also means doing regular breast self-exams. Let your health care provider know about any changes, no matter how small.  If you are in your 20s or 30s, you should have  a clinical breast exam (CBE) by a health care provider every 1-3 years as part of a regular health exam.  If you are 4 or older, have a CBE every year. Also consider having a breast X-ray (mammogram) every year.  If you have a family history of breast cancer, talk to your health care provider about genetic screening.  If you are at high risk for breast cancer, talk to your health care provider about having an MRI and a mammogram every year.  Breast cancer gene (BRCA)  assessment is recommended for women who have family members with BRCA-related cancers. BRCA-related cancers include: ? Breast. ? Ovarian. ? Tubal. ? Peritoneal cancers.  Results of the assessment will determine the need for genetic counseling and BRCA1 and BRCA2 testing.  Cervical Cancer Your health care provider may recommend that you be screened regularly for cancer of the pelvic organs (ovaries, uterus, and vagina). This screening involves a pelvic examination, including checking for microscopic changes to the surface of your cervix (Pap test). You may be encouraged to have this screening done every 3 years, beginning at age 17.  For women ages 52-65, health care providers may recommend pelvic exams and Pap testing every 3 years, or they may recommend the Pap and pelvic exam, combined with testing for human papilloma virus (HPV), every 5 years. Some types of HPV increase your risk of cervical cancer. Testing for HPV may also be done on women of any age with unclear Pap test results.  Other health care providers may not recommend any screening for nonpregnant women who are considered low risk for pelvic cancer and who do not have symptoms. Ask your health care provider if a screening pelvic exam is right for you.  If you have had past treatment for cervical cancer or a condition that could lead to cancer, you need Pap tests and screening for cancer for at least 20 years after your treatment. If Pap tests have been discontinued, your risk factors (such as having a new sexual partner) need to be reassessed to determine if screening should resume. Some women have medical problems that increase the chance of getting cervical cancer. In these cases, your health care provider may recommend more frequent screening and Pap tests.  Colorectal Cancer  This type of cancer can be detected and often prevented.  Routine colorectal cancer screening usually begins at 32 years of age and continues through 32  years of age.  Your health care provider may recommend screening at an earlier age if you have risk factors for colon cancer.  Your health care provider may also recommend using home test kits to check for hidden blood in the stool.  A small camera at the end of a tube can be used to examine your colon directly (sigmoidoscopy or colonoscopy). This is done to check for the earliest forms of colorectal cancer.  Routine screening usually begins at age 58.  Direct examination of the colon should be repeated every 5-10 years through 32 years of age. However, you may need to be screened more often if early forms of precancerous polyps or small growths are found.  Skin Cancer  Check your skin from head to toe regularly.  Tell your health care provider about any new moles or changes in moles, especially if there is a change in a mole's shape or color.  Also tell your health care provider if you have a mole that is larger than the size of a pencil eraser.  Always use  sunscreen. Apply sunscreen liberally and repeatedly throughout the day.  Protect yourself by wearing long sleeves, pants, a wide-brimmed hat, and sunglasses whenever you are outside.  Heart disease, diabetes, and high blood pressure  High blood pressure causes heart disease and increases the risk of stroke. High blood pressure is more likely to develop in: ? People who have blood pressure in the high end of the normal range (130-139/85-89 mm Hg). ? People who are overweight or obese. ? People who are African American.  If you are 62-46 years of age, have your blood pressure checked every 3-5 years. If you are 43 years of age or older, have your blood pressure checked every year. You should have your blood pressure measured twice-once when you are at a hospital or clinic, and once when you are not at a hospital or clinic. Record the average of the two measurements. To check your blood pressure when you are not at a hospital or  clinic, you can use: ? An automated blood pressure machine at a pharmacy. ? A home blood pressure monitor.  If you are between 32 years and 80 years old, ask your health care provider if you should take aspirin to prevent strokes.  Have regular diabetes screenings. This involves taking a blood sample to check your fasting blood sugar level. ? If you are at a normal weight and have a low risk for diabetes, have this test once every three years after 32 years of age. ? If you are overweight and have a high risk for diabetes, consider being tested at a younger age or more often. Preventing infection Hepatitis B  If you have a higher risk for hepatitis B, you should be screened for this virus. You are considered at high risk for hepatitis B if: ? You were born in a country where hepatitis B is common. Ask your health care provider which countries are considered high risk. ? Your parents were born in a high-risk country, and you have not been immunized against hepatitis B (hepatitis B vaccine). ? You have HIV or AIDS. ? You use needles to inject street drugs. ? You live with someone who has hepatitis B. ? You have had sex with someone who has hepatitis B. ? You get hemodialysis treatment. ? You take certain medicines for conditions, including cancer, organ transplantation, and autoimmune conditions.  Hepatitis C  Blood testing is recommended for: ? Everyone born from [birth year] through 1965. ? Anyone with known risk factors for hepatitis C.  Sexually transmitted infections (STIs)  You should be screened for sexually transmitted infections (STIs) including gonorrhea and chlamydia if: ? You are sexually active and are younger than 32 years of age. ? You are older than 32 years of age and your health care provider tells you that you are at risk for this type of infection. ? Your sexual activity has changed since you were last screened and you are at an increased risk for chlamydia or gonorrhea.  Ask your health care provider if you are at risk.  If you do not have HIV, but are at risk, it may be recommended that you take a prescription medicine daily to prevent HIV infection. This is called pre-exposure prophylaxis (PrEP). You are considered at risk if: ? You are sexually active and do not regularly use condoms or know the HIV status of your partner(s). ? You take drugs by injection. ? You are sexually active with a partner who has HIV.  Talk with your health  care provider about whether you are at high risk of being infected with HIV. If you choose to begin PrEP, you should first be tested for HIV. You should then be tested every 3 months for as long as you are taking PrEP. Pregnancy  If you are premenopausal and you may become pregnant, ask your health care provider about preconception counseling.  If you may become pregnant, take 400 to 800 micrograms (mcg) of folic acid every day.  If you want to prevent pregnancy, talk to your health care provider about birth control (contraception). Osteoporosis and menopause  Osteoporosis is a disease in which the bones lose minerals and strength with aging. This can result in serious bone fractures. Your risk for osteoporosis can be identified using a bone density scan.  If you are 40 years of age or older, or if you are at risk for osteoporosis and fractures, ask your health care provider if you should be screened.  Ask your health care provider whether you should take a calcium or vitamin D supplement to lower your risk for osteoporosis.  Menopause may have certain physical symptoms and risks.  Hormone replacement therapy may reduce some of these symptoms and risks. Talk to your health care provider about whether hormone replacement therapy is right for you. Follow these instructions at home:  Schedule regular health, dental, and eye exams.  Stay current with your immunizations.  Do not use any tobacco products including cigarettes,  chewing tobacco, or electronic cigarettes.  If you are pregnant, do not drink alcohol.  If you are breastfeeding, limit how much and how often you drink alcohol.  Limit alcohol intake to no more than 1 drink per day for nonpregnant women. One drink equals 12 ounces of beer, 5 ounces of wine, or 1 ounces of hard liquor.  Do not use street drugs.  Do not share needles.  Ask your health care provider for help if you need support or information about quitting drugs.  Tell your health care provider if you often feel depressed.  Tell your health care provider if you have ever been abused or do not feel safe at home. This information is not intended to replace advice given to you by your health care provider. Make sure you discuss any questions you have with your health care provider. Document Released: 11/01/2010 Document Revised: 09/24/2015 Document Reviewed: 01/20/2015 Elsevier Interactive Patient Education  [employer].

## 2017-07-06 NOTE — Addendum Note (Signed)
Addended by: Elouise Munroe on: 07/06/2017 10:54 AM   Modules accepted: Orders

## 2017-07-10 ENCOUNTER — Other Ambulatory Visit: Payer: Managed Care, Other (non HMO)

## 2017-07-11 LAB — TSH: TSH: 1.89 u[IU]/mL (ref 0.450–4.500)

## 2017-07-11 LAB — HEMOGLOBIN A1C
ESTIMATED AVERAGE GLUCOSE: 120 mg/dL
HEMOGLOBIN A1C: 5.8 % — AB (ref 4.8–5.6)

## 2017-07-11 LAB — LIPID PANEL
CHOLESTEROL TOTAL: 247 mg/dL — AB (ref 100–199)
Chol/HDL Ratio: 4.3 ratio (ref 0.0–4.4)
HDL: 57 mg/dL (ref >39)
LDL Calculated: 175 mg/dL — ABNORMAL HIGH (ref 0–99)
Triglycerides: 77 mg/dL (ref 0–149)
VLDL Cholesterol Cal: 15 mg/dL (ref 5–40)

## 2017-07-11 LAB — GLUCOSE, RANDOM: Glucose: 101 mg/dL — ABNORMAL HIGH (ref 65–99)

## 2017-07-13 LAB — IGP, COBASHPV16/18
HPV 16: NEGATIVE
HPV 18: NEGATIVE
HPV other hr types: NEGATIVE
PAP SMEAR COMMENT: 0

## 2017-08-28 ENCOUNTER — Ambulatory Visit: Payer: Self-pay | Admitting: Family Medicine

## 2017-08-28 VITALS — BP 113/71 | HR 76 | Resp 20

## 2017-08-28 DIAGNOSIS — H6983 Other specified disorders of Eustachian tube, bilateral: Secondary | ICD-10-CM

## 2017-08-28 MED ORDER — FLUTICASONE PROPIONATE 50 MCG/ACT NA SUSP: 2.0000 | Freq: Every day | NASAL | 1 refills | Status: DC

## 2017-08-28 NOTE — Progress Notes (Signed)
Subjective: Right ear pain HPI:  Jacqueline Rogers is a 32 y.o. female who presents for evaluation of right ear pressure, bilateral muffled hearing, PND, and rhinorrhea for 1 week.  Patient also reports intermittent, mild feeling of disequilibrium.   Denies fevers, chills, malaise, fatigue, any other systemic symptoms.  Patient has a history of seasonal allergic rhinitis and reports recent worsening of her symptoms.  Patient takes nothing currently for this.  Denies any significant history of ear problems or surgeries in the past.  Denies any cardiac or neurologic symptoms. Treatment to date: Tylenol.  Denies any other symptoms or complaints.   Review of Systems Pertinent items noted in HPI and remainder of comprehensive ROS otherwise negative.     Objective:   Physical Exam General: Awake, alert, and oriented. No acute distress. Well developed, hydrated and nourished. Appears stated age. Nontoxic appearance.  HEENT:  PND noted.  No erythema to posterior oropharynx.  No edema or exudates of pharynx or tonsils. No erythema, opaque fluid, or bulging of TM.  Mild air/fluid level noted to TMs bilaterally.  TMs otherwise normal.  Pale, boggy nasal mucosa. Sinuses nontender. Supple neck without adenopathy. Cardiac: Heart rate and rhythm are normal. No murmurs, gallops, or rubs are auscultated. S1 and S2 are heard and are of normal intensity.  Respiratory: No signs of respiratory distress. Lungs clear. No tachypnea. Able to speak in full sentences without dyspnea. Nonlabored respirations.   Skin: Skin is warm, dry and intact. Appropriate color for ethnicity. No cyanosis noted.    Assessment:   Eustachian Tube Dysfunction Allergic Rhinitis   Plan:   Prescribed Flonase and educated patient regarding dosage and instructions for use.  Discussed red flag symptoms and circumstances with which to seek medical care.   New Prescriptions   FLUTICASONE (FLONASE) 50 MCG/ACT NASAL SPRAY    Place 2 sprays  into both nostrils daily.

## 2017-10-31 ENCOUNTER — Telehealth: Payer: Self-pay | Admitting: Obstetrics and Gynecology

## 2017-10-31 NOTE — Telephone Encounter (Signed)
The patient called and stated that she would like to speak with her nurse in regards to getting medication for anxiety for a flight she has to go on. Please advise. The patient also stated that she is not available around 12 pm.

## 2017-11-01 NOTE — Telephone Encounter (Signed)
[place]. Her flight is 8h. She is requesting antianxiety meds for the flight. Pls advise.

## 2017-11-06 ENCOUNTER — Telehealth: Payer: Self-pay | Admitting: Obstetrics and Gynecology

## 2017-11-06 NOTE — Telephone Encounter (Signed)
The patient called and stated that she would like to speak with Jacqueline Rogers. No other information was disclosed. Please advise.  °

## 2017-11-07 MED ORDER — LORAZEPAM 1 MG PO TABS: 1.0000 mg | ORAL_TABLET | ORAL | 0 refills | Status: DC | PRN

## 2017-11-07 NOTE — Telephone Encounter (Signed)
See previous phone encounter.

## 2017-11-07 NOTE — Addendum Note (Signed)
Addended by: Elouise Munroe on: 11/07/2017 11:43 AM   Modules accepted: Orders

## 2017-11-07 NOTE — Telephone Encounter (Signed)
Mad ok to erx ativan. LM for pt to come by office after 1:30 today to pick up.

## 2018-06-04 ENCOUNTER — Ambulatory Visit: Payer: Self-pay | Admitting: Adult Health

## 2018-06-04 VITALS — BP 112/70 | HR 93 | Temp 98.6°F | Resp 16 | Wt 169.0 lb

## 2018-06-04 DIAGNOSIS — H66002 Acute suppurative otitis media without spontaneous rupture of ear drum, left ear: Secondary | ICD-10-CM

## 2018-06-04 MED ORDER — AMOXICILLIN-POT CLAVULANATE 875-125 MG PO TABS: 1.0000 | ORAL_TABLET | Freq: Two times a day (BID) | ORAL | 0 refills | Status: DC

## 2018-06-04 MED ORDER — IBUPROFEN 600 MG PO TABS: 600.0000 mg | ORAL_TABLET | Freq: Three times a day (TID) | ORAL | 0 refills | Status: DC | PRN

## 2018-06-04 MED ORDER — FLUCONAZOLE 150 MG PO TABS: 150.0000 mg | ORAL_TABLET | Freq: Once | ORAL | 0 refills | Status: AC

## 2018-06-04 NOTE — Progress Notes (Signed)
[facility] Subjective:     Patient ID: Jacqueline Rogers, female   DOB: 1985-10-16, 33 y.o.   MRN: [id number]  HPI  Blood pressure 112/70, pulse 93, temperature 98.6 F (37 C), resp. rate 16, weight 169 lb (76.7 kg), last menstrual period 05/12/2018, SpO2 99 %. Patient is a 33 year old female in no acute distress with bilateral ear pain, she has had increased cerumen  from left ear.   Maxillary facial pressure.  She has had cold symptoms x 2 weeks intermittently.  Mild chills last two nights.    Patient's last menstrual period was 05/12/2018.  Allergies  Allergen Reactions  . Zithromax [Azithromycin] Rash    Patient  denies any fever, body aches, rash, chest pain, shortness of breath, nausea, vomiting, or diarrhea.   Review of Systems  Constitutional: Negative for activity change, appetite change, chills, diaphoresis, fatigue, fever and unexpected weight change.  HENT: Positive for congestion, ear discharge (cerumen left ear ), ear pain, sinus pressure (maxillary ) and sore throat. Negative for dental problem, drooling, facial swelling, hearing loss, mouth sores, nosebleeds, postnasal drip, rhinorrhea, sinus pain, sneezing, tinnitus, trouble swallowing and voice change.   Eyes: Negative.   Respiratory: Negative for apnea, cough, choking, chest tightness, shortness of breath, wheezing and stridor.   Cardiovascular: Negative for chest pain, palpitations and leg swelling.  Gastrointestinal: Negative.   Endocrine: Negative.   Genitourinary: Negative.   Musculoskeletal: Negative.   Skin: Negative.   Neurological: Negative.   Hematological: Negative.   Psychiatric/Behavioral: Negative.        Objective:   Physical Exam Vitals signs reviewed.  Constitutional:      General: She is not in acute distress.    Appearance: Normal appearance. She is not ill-appearing, toxic-appearing or diaphoretic.  HENT:     Head: Normocephalic and  atraumatic.     Salivary Glands: Right salivary gland is not diffusely enlarged or tender. Left salivary gland is not diffusely enlarged or tender.     Right Ear: Hearing, ear canal and external ear normal. A middle ear effusion is present. There is no impacted cerumen. No mastoid tenderness. Tympanic membrane is not perforated or erythematous.     Left Ear: Hearing, ear canal and external ear normal. A middle ear effusion is present. There is no impacted cerumen. No mastoid tenderness. Tympanic membrane is erythematous and bulging. Tympanic membrane is not perforated.     Nose: Mucosal edema, congestion and rhinorrhea present.     Right Sinus: No maxillary sinus tenderness or frontal sinus tenderness.     Left Sinus: No maxillary sinus tenderness or frontal sinus tenderness.     Mouth/Throat:     Mouth: Mucous membranes are moist.     Pharynx: No oropharyngeal exudate or posterior oropharyngeal erythema.  Eyes:     General: No scleral icterus.       Right eye: No discharge.        Left eye: No discharge.     Conjunctiva/sclera: Conjunctivae normal.     Pupils: Pupils are equal, round, and reactive to light.  Neck:     Musculoskeletal: Full passive range of motion without pain, normal range of motion and neck supple. No neck rigidity or muscular tenderness.     Trachea: Trachea and phonation normal.  Cardiovascular:     Rate and Rhythm: Normal rate and regular rhythm.     Heart sounds: No murmur. No friction rub. No gallop.   Pulmonary:  Effort: Pulmonary effort is normal. No respiratory distress.     Breath sounds: Normal breath sounds. No stridor. No wheezing, rhonchi or rales.  Chest:     Chest wall: No tenderness.  Musculoskeletal: Normal range of motion.  Lymphadenopathy:     Cervical: Cervical adenopathy (left superficial soft mobile less than 0.5cm ) present.     Right cervical: No superficial, deep or posterior cervical adenopathy.    Left cervical: Superficial cervical  adenopathy present. No deep or posterior cervical adenopathy.  Skin:    General: Skin is warm and dry.     Capillary Refill: Capillary refill takes less than 2 seconds.  Neurological:     Mental Status: She is alert and oriented to person, place, and time.  Psychiatric:        Mood and Affect: Mood normal.        Behavior: Behavior normal.        Thought Content: Thought content normal.        Judgment: Judgment normal.        Assessment:     Non-recurrent acute suppurative otitis media of left ear without spontaneous rupture of tympanic membrane      Plan:     Meds ordered this encounter  Medications  . amoxicillin-clavulanate (AUGMENTIN) 875-125 MG tablet    Sig: Take 1 tablet by mouth 2 (two) times daily.    Dispense:  20 tablet    Refill:  0  . fluconazole (DIFLUCAN) 150 MG tablet    Sig: Take 1 tablet (150 mg total) by mouth once for 1 dose. Only if needed.    Dispense:  1 tablet    Refill:  0  . ibuprofen (ADVIL,MOTRIN) 600 MG tablet    Sig: Take 1 tablet (600 mg total) by mouth every 8 (eight) hours as needed.    Dispense:  30 tablet    Refill:  0     Patient declined recommended Terazol cream for yeast infection, requests Diflucan. She denies any liver disease or damage. Patient will only take if yeast symptoms develop and if develops worsening symptoms will seek medical treatment immediately.   Flonase recommended per package instructions.  Can use nasal decongestant per package instructions no longer than 7 days.   Advised patient call the office or your primary care doctor for an appointment if no improvement within 72 hours or if any symptoms change or worsen at any time  Advised ER or urgent Care if after hours or on weekend. Call 911 for emergency symptoms at any time.Patinet verbalized understanding of all instructions given/reviewed and treatment plan and has no further questions or concerns at this time.    Patient verbalized understanding of all  instructions given and denies any further questions at this time.

## 2018-06-04 NOTE — Patient Instructions (Signed)
Amoxicillin; Clavulanic Acid tablets What is this medicine? AMOXICILLIN; CLAVULANIC ACID (a mox i SIL in; KLAV yoo lan ic AS id) is a penicillin antibiotic. It is used to treat certain kinds of bacterial infections. It will not work for colds, flu, or other viral infections. This medicine may be used for other purposes; ask your health care provider or pharmacist if you have questions. COMMON BRAND NAME(S): Augmentin What should I tell my health care provider before I take this medicine? They need to know if you have any of these conditions: -bowel disease, like colitis -kidney disease -liver disease -mononucleosis -an unusual or allergic reaction to amoxicillin, penicillin, cephalosporin, other antibiotics, clavulanic acid, other medicines, foods, dyes, or preservatives -pregnant or trying to get pregnant -breast-feeding How should I use this medicine? Take this medicine by mouth with a full glass of water. Follow the directions on the prescription label. Take at the start of a meal. Do not crush or chew. If the tablet has a score line, you may cut it in half at the score line for easier swallowing. Take your medicine at regular intervals. Do not take your medicine more often than directed. Take all of your medicine as directed even if you think you are better. Do not skip doses or stop your medicine early. Talk to your pediatrician regarding the use of this medicine in children. Special care may be needed. Overdosage: If you think you have taken too much of this medicine contact a poison control center or emergency room at once. NOTE: This medicine is only for you. Do not share this medicine with others. What if I miss a dose? If you miss a dose, take it as soon as you can. If it is almost time for your next dose, take only that dose. Do not take double or extra doses. What may interact with this medicine? -allopurinol -anticoagulants -birth control pills -methotrexate -probenecid This  list may not describe all possible interactions. Give your health care provider a list of all the medicines, herbs, non-prescription drugs, or dietary supplements you use. Also tell them if you smoke, drink alcohol, or use illegal drugs. Some items may interact with your medicine. What should I watch for while using this medicine? Tell your doctor or health care professional if your symptoms do not improve. Do not treat diarrhea with over the counter products. Contact your doctor if you have diarrhea that lasts more than 2 days or if it is severe and watery. If you have diabetes, you may get a false-positive result for sugar in your urine. Check with your doctor or health care professional. Birth control pills may not work properly while you are taking this medicine. Talk to your doctor about using an extra method of birth control. What side effects may I notice from receiving this medicine? Side effects that you should report to your doctor or health care professional as soon as possible: -allergic reactions like skin rash, itching or hives, swelling of the face, lips, or tongue -breathing problems -dark urine -fever or chills, sore throat -redness, blistering, peeling or loosening of the skin, including inside the mouth -seizures -trouble passing urine or change in the amount of urine -unusual bleeding, bruising -unusually weak or tired -white patches or sores in the mouth or throat Side effects that usually do not require medical attention (report to your doctor or health care professional if they continue or are bothersome): -diarrhea -dizziness -headache -nausea, vomiting -stomach upset -vaginal or anal irritation This list may   not describe all possible side effects. Call your doctor for medical advice about side effects. You may report side effects to FDA at 1-800-FDA-1088. Where should I keep my medicine? Keep out of the reach of children. Store at room temperature below 25 degrees  C (77 degrees F). Keep container tightly closed. Throw away any unused medicine after the expiration date. NOTE: This sheet is a summary. It may not cover all possible information. If you have questions about this medicine, talk to your doctor, pharmacist, or health care provider.  2019 Elsevier/Gold Standard (2007-07-12 12:04:30) Fluconazole tablets What is this medicine? FLUCONAZOLE (floo KON na zole) is an antifungal medicine. It is used to treat certain kinds of fungal or yeast infections. This medicine may be used for other purposes; ask your health care provider or pharmacist if you have questions. COMMON BRAND NAME(S): Diflucan What should I tell my health care provider before I take this medicine? They need to know if you have any of these conditions: -history of irregular heart beat -kidney disease -an unusual or allergic reaction to fluconazole, other azole antifungals, medicines, foods, dyes, or preservatives -pregnant or trying to get pregnant -breast-feeding How should I use this medicine? Take this medicine by mouth. Follow the directions on the prescription label. Do not take your medicine more often than directed. Talk to your pediatrician regarding the use of this medicine in children. Special care may be needed. This medicine has been used in children as young as 48 months of age. Overdosage: If you think you have taken too much of this medicine contact a poison control center or emergency room at once. NOTE: This medicine is only for you. Do not share this medicine with others. What if I miss a dose? If you miss a dose, take it as soon as you can. If it is almost time for your next dose, take only that dose. Do not take double or extra doses. What may interact with this medicine? Do not take this medicine with any of the following medications: -astemizole -certain medicines for irregular heart beat like dofetilide, dronedarone,  quinidine -cisapride -erythromycin -lomitapide -other medicines that prolong the QT interval (cause an abnormal heart rhythm) -pimozide -terfenadine -thioridazine -tolvaptan -ziprasidone This medicine may also interact with the following medications: -antiviral medicines for HIV or AIDS -birth control pills -certain antibiotics like rifabutin, rifampin -certain medicines for blood pressure like amlodipine, isradipine, felodipine, hydrochlorothiazide, losartan, nifedipine -certain medicines for cancer like cyclophosphamide, vinblastine, vincristine -certain medicines for cholesterol like atorvastatin, lovastatin, fluvastatin, simvastatin -certain medicines for depression, anxiety, or psychotic disturbances like amitriptyline, midazolam, nortriptyline, triazolam -certain medicines for diabetes like glipizide, glyburide, tolbutamide -certain medicines for pain like alfentanil, fentanyl, methadone -certain medicines for seizures like carbamazepine, phenytoin -certain medicines that treat or prevent blood clots like warfarin -halofantrine -medicines that lower your chance of fighting infection like cyclosporine, prednisone, tacrolimus -NSAIDS, medicines for pain and inflammation, like celecoxib, diclofenac, flurbiprofen, ibuprofen, meloxicam, naproxen -other medicines for fungal infections -sirolimus -theophylline -tofacitinib This list may not describe all possible interactions. Give your health care provider a list of all the medicines, herbs, non-prescription drugs, or dietary supplements you use. Also tell them if you smoke, drink alcohol, or use illegal drugs. Some items may interact with your medicine. What should I watch for while using this medicine? Visit your doctor or health care professional for regular checkups. If you are taking this medicine for a long time you may need blood work. Tell your doctor if your symptoms do  not improve. Some fungal infections need many weeks or  months of treatment to cure. Alcohol can increase possible damage to your liver. Avoid alcoholic drinks. If you have a vaginal infection, do not have sex until you have finished your treatment. You can wear a sanitary napkin. Do not use tampons. Wear freshly washed cotton, not synthetic, panties. What side effects may I notice from receiving this medicine? Side effects that you should report to your doctor or health care professional as soon as possible: -allergic reactions like skin rash or itching, hives, swelling of the lips, mouth, tongue, or throat -dark urine -feeling dizzy or faint -irregular heartbeat or chest pain -redness, blistering, peeling or loosening of the skin, including inside the mouth -trouble breathing -unusual bruising or bleeding -vomiting -yellowing of the eyes or skin Side effects that usually do not require medical attention (report to your doctor or health care professional if they continue or are bothersome): -changes in how food tastes -diarrhea -headache -stomach upset or nausea This list may not describe all possible side effects. Call your doctor for medical advice about side effects. You may report side effects to FDA at 1-800-FDA-1088. Where should I keep my medicine? Keep out of the reach of children. Store at room temperature below 30 degrees C (86 degrees F). Throw away any medicine after the expiration date. NOTE: This sheet is a summary. It may not cover all possible information. If you have questions about this medicine, talk to your doctor, pharmacist, or health care provider.  2019 Elsevier/Gold Standard (2012-11-24 19:37:38) Sinusitis, Adult Sinusitis is soreness and swelling (inflammation) of your sinuses. Sinuses are hollow spaces in the bones around your face. They are located:  Around your eyes.  In the middle of your forehead.  Behind your nose.  In your cheekbones. Your sinuses and nasal passages are lined with a fluid called mucus.  Mucus drains out of your sinuses. Swelling can trap mucus in your sinuses. This lets germs (bacteria, virus, or fungus) grow, which leads to infection. Most of the time, this condition is caused by a virus. What are the causes? This condition is caused by:  Allergies.  Asthma.  Germs.  Things that block your nose or sinuses.  Growths in the nose (nasal polyps).  Chemicals or irritants in the air.  Fungus (rare). What increases the risk? You are more likely to develop this condition if:  You have a weak body defense system (immune system).  You do a lot of swimming or diving.  You use nasal sprays too much.  You smoke. What are the signs or symptoms? The main symptoms of this condition are pain and a feeling of pressure around the sinuses. Other symptoms include:  Stuffy nose (congestion).  Runny nose (drainage).  Swelling and warmth in the sinuses.  Headache.  Toothache.  A cough that may get worse at night.  Mucus that collects in the throat or the back of the nose (postnasal drip).  Being unable to smell and taste.  Being very tired (fatigue).  A fever.  Sore throat.  Bad breath. How is this diagnosed? This condition is diagnosed based on:  Your symptoms.  Your medical history.  A physical exam.  Tests to find out if your condition is short-term (acute) or long-term (chronic). Your doctor may: ? Check your nose for growths (polyps). ? Check your sinuses using a tool that has a light (endoscope). ? Check for allergies or germs. ? Do imaging tests, such as an MRI  or CT scan. How is this treated? Treatment for this condition depends on the cause and whether it is short-term or long-term.  If caused by a virus, your symptoms should go away on their own within 10 days. You may be given medicines to relieve symptoms. They include: ? Medicines that shrink swollen tissue in the nose. ? Medicines that treat allergies (antihistamines). ? A spray that  treats swelling of the nostrils. ? Rinses that help get rid of thick mucus in your nose (nasal saline washes).  If caused by bacteria, your doctor may wait to see if you will get better without treatment. You may be given antibiotic medicine if you have: ? A very bad infection. ? A weak body defense system.  If caused by growths in the nose, you may need to have surgery. Follow these instructions at home: Medicines  Take, use, or apply over-the-counter and prescription medicines only as told by your doctor. These may include nasal sprays.  If you were prescribed an antibiotic medicine, take it as told by your doctor. Do not stop taking the antibiotic even if you start to feel better. Hydrate and humidify   Drink enough water to keep your pee (urine) pale yellow.  Use a cool mist humidifier to keep the humidity level in your home above 50%.  Breathe in steam for 10-15 minutes, 3-4 times a day, or as told by your doctor. You can do this in the bathroom while a hot shower is running.  Try not to spend time in cool or dry air. Rest  Rest as much as you can.  Sleep with your head raised (elevated).  Make sure you get enough sleep each night. General instructions   Put a warm, moist washcloth on your face 3-4 times a day, or as often as told by your doctor. This will help with discomfort.  Wash your hands often with soap and water. If there is no soap and water, use hand sanitizer.  Do not smoke. Avoid being around people who are smoking (secondhand smoke).  Keep all follow-up visits as told by your doctor. This is important. Contact a doctor if:  You have a fever.  Your symptoms get worse.  Your symptoms do not get better within 10 days. Get help right away if:  You have a very bad headache.  You cannot stop throwing up (vomiting).  You have very bad pain or swelling around your face or eyes.  You have trouble seeing.  You feel confused.  Your neck is  stiff.  You have trouble breathing. Summary  Sinusitis is swelling of your sinuses. Sinuses are hollow spaces in the bones around your face.  This condition is caused by tissues in your nose that become inflamed or swollen. This traps germs. These can lead to infection.  If you were prescribed an antibiotic medicine, take it as told by your doctor. Do not stop taking it even if you start to feel better.  Keep all follow-up visits as told by your doctor. This is important. This information is not intended to replace advice given to you by your health care provider. Make sure you discuss any questions you have with your health care provider. Document Released: 10/05/2007 Document Revised: 09/18/2017 Document Reviewed: 09/18/2017 Elsevier Interactive Patient Education  2019 [employer].

## 2018-07-10 ENCOUNTER — Ambulatory Visit (INDEPENDENT_AMBULATORY_CARE_PROVIDER_SITE_OTHER): Payer: Managed Care, Other (non HMO) | Admitting: Obstetrics and Gynecology

## 2018-07-10 ENCOUNTER — Encounter: Payer: Self-pay | Admitting: Obstetrics and Gynecology

## 2018-07-10 VITALS — BP 119/78 | HR 88 | Ht 62.0 in | Wt 169.2 lb

## 2018-07-10 DIAGNOSIS — Z01419 Encounter for gynecological examination (general) (routine) without abnormal findings: Secondary | ICD-10-CM | POA: Diagnosis not present

## 2018-07-10 DIAGNOSIS — E7800 Pure hypercholesterolemia, unspecified: Secondary | ICD-10-CM

## 2018-07-10 DIAGNOSIS — G479 Sleep disorder, unspecified: Secondary | ICD-10-CM

## 2018-07-10 DIAGNOSIS — R638 Other symptoms and signs concerning food and fluid intake: Secondary | ICD-10-CM

## 2018-07-10 DIAGNOSIS — R7303 Prediabetes: Secondary | ICD-10-CM

## 2018-07-10 DIAGNOSIS — F419 Anxiety disorder, unspecified: Secondary | ICD-10-CM

## 2018-07-10 DIAGNOSIS — D259 Leiomyoma of uterus, unspecified: Secondary | ICD-10-CM

## 2018-07-10 DIAGNOSIS — Z8639 Personal history of other endocrine, nutritional and metabolic disease: Secondary | ICD-10-CM

## 2018-07-10 MED ORDER — BUPROPION HCL ER (XL) 150 MG PO TB24: 150.0000 mg | ORAL_TABLET | Freq: Every day | ORAL | 6 refills | Status: DC

## 2018-07-10 MED ORDER — TRIAZOLAM 0.125 MG PO TABS: 0.1250 mg | ORAL_TABLET | Freq: Every evening | ORAL | 0 refills | Status: DC | PRN

## 2018-07-10 NOTE — Progress Notes (Signed)
Subjective:   Jacqueline Rogers is a 33 y.o. [id number] african [place]. female here for a routine well-woman exam.  Patient's last menstrual period was 07/09/2018.    Current complaints: trying to lose weight, exercising 4-5 days a week. Some depression and not sleeping well since mom past away in October. Depression screen PHQ 2/9 07/10/2018  Decreased Interest 1  Down, Depressed, Hopeless 0  PHQ - 2 Score 1  Altered sleeping 3  Tired, decreased energy 1  Change in appetite 2  Feeling bad or failure about yourself  0  Trouble concentrating 0  Moving slowly or fidgety/restless 0  Suicidal thoughts 0  PHQ-9 Score 7  Difficult doing work/chores Somewhat difficult   PCP: none       Does need labs  Social History: Sexual: heterosexual Marital Status: single Living situation: with 54 years old son Occupation: Programmer, applications Tobacco/alcohol: no tobacco use Illicit drugs: no history of illicit drug use  The following portions of the patient's history were reviewed and updated as appropriate: allergies, current medications, past family history, past medical history, past social history, past surgical history and problem list.  Past Medical History Past Medical History:  Diagnosis Date  . Condyloma   . LGSIL (low grade squamous intraepithelial dysplasia)   . Migraine   . TMJ (dislocation of temporomandibular joint)   . Vitamin D deficiency     Past Surgical History Past Surgical History:  Procedure Laterality Date  . none      Gynecologic History [id number]  Patient's last menstrual period was 07/09/2018. Contraception: abstinence Last Pap: 06/2017. Results were: normal Last mammogram: NA Obstetric History OB History  Gravida Para Term Preterm AB Living  3 1 1   2 1   SAB TAB Ectopic Multiple Live Births    2     1    # Outcome Date GA Lbr Len/2nd Weight Sex Delivery Anes PTL Lv  3 TAB 2018          2 TAB 2017          1 Term 2014   5 lb 2.2 oz (2.331 kg) M Vag-Spont    LIV    Current Medications Current Outpatient Medications on File Prior to Visit  Medication Sig Dispense Refill  . amoxicillin-clavulanate (AUGMENTIN) 875-125 MG tablet Take 1 tablet by mouth 2 (two) times daily. (Patient not taking: Reported on 07/10/2018) 20 tablet 0  . ibuprofen (ADVIL,MOTRIN) 600 MG tablet Take 1 tablet (600 mg total) by mouth every 8 (eight) hours as needed. (Patient not taking: Reported on 07/10/2018) 30 tablet 0   No current facility-administered medications on file prior to visit.     Review of Systems Patient denies any headaches, blurred vision, shortness of breath, chest pain, abdominal pain, problems with bowel movements, urination, or intercourse.  Objective:  BP 119/78   Pulse 88   Ht 5\' 2"  (1.575 m)   Wt 169 lb 3.2 oz (76.7 kg)   LMP 07/09/2018   BMI 30.95 kg/m  Physical Exam  General:  Well developed, well nourished, no acute distress. She is alert and oriented x3. Skin:  Warm and dry Neck:  Midline trachea, no thyromegaly or nodules Cardiovascular: Regular rate and rhythm, no murmur heard Lungs:  Effort normal, all lung fields clear to auscultation bilaterally Breasts:  No dominant palpable mass, retraction, or nipple discharge Abdomen:  Soft, non tender, no hepatosplenomegaly or masses Pelvic:  External genitalia is normal in appearance.  The vagina is normal in appearance.  The cervix is bulbous, no CMT.  Thin prep pap is not done . Uterus is enlarged with fundus just below umbilicus, nodular and firm, but mobile.  No adnexal masses or tenderness noted. Extremities:  No swelling or varicosities noted Psych:  She has a normal mood and affect  Assessment:   Healthy well-woman exam BMI 30 Family h/o Breast cancer Pre-diabetes Elevated cholesterol Anxiety Sleep disturbance Enlarged uterus-known fibroid  Plan:  Labs obtained- will follow up accordingly Will try wellbutrin and triazolam, and let me know how it is working via MyChart Pelvic  ultrasound ordered- will follow up accordingly. F/U 1 year for AE, or sooner if needed Mammogram due at age 79   [place], [place]

## 2018-07-10 NOTE — Patient Instructions (Addendum)
 Preventive Care 18-39 Years, Female Preventive care refers to lifestyle choices and visits with your health care provider that can promote health and wellness. What does preventive care include?   A yearly physical exam. This is also called an annual well check.  Dental exams once or twice a year.  Routine eye exams. Ask your health care provider how often you should have your eyes checked.  Personal lifestyle choices, including: ? Daily care of your teeth and gums. ? Regular physical activity. ? Eating a healthy diet. ? Avoiding tobacco and drug use. ? Limiting alcohol use. ? Practicing safe sex. ? Taking vitamin and mineral supplements as recommended by your health care provider. What happens during an annual well check? The services and screenings done by your health care provider during your annual well check will depend on your age, overall health, lifestyle risk factors, and family history of disease. Counseling Your health care provider may ask you questions about your:  Alcohol use.  Tobacco use.  Drug use.  Emotional well-being.  Home and relationship well-being.  Sexual activity.  Eating habits.  Work and work environment.  Method of birth control.  Menstrual cycle.  Pregnancy history. Screening You may have the following tests or measurements:  Height, weight, and BMI.  Diabetes screening. This is done by checking your blood sugar (glucose) after you have not eaten for a while (fasting).  Blood pressure.  Lipid and cholesterol levels. These may be checked every 5 years starting at age 20.  Skin check.  Hepatitis C blood test.  Hepatitis B blood test.  Sexually transmitted disease (STD) testing.  BRCA-related cancer screening. This may be done if you have a family history of breast, ovarian, tubal, or peritoneal cancers.  Pelvic exam and Pap test. This may be done every 3 years starting at age 21. Starting at age 30, this may be done  every 5 years if you have a Pap test in combination with an HPV test. Discuss your test results, treatment options, and if necessary, the need for more tests with your health care provider. Vaccines Your health care provider may recommend certain vaccines, such as:  Influenza vaccine. This is recommended every year.  Tetanus, diphtheria, and acellular pertussis (Tdap, Td) vaccine. You may need a Td booster every 10 years.  Varicella vaccine. You may need this if you have not been vaccinated.  HPV vaccine. If you are 26 or younger, you may need three doses over 6 months.  Measles, mumps, and rubella (MMR) vaccine. You may need at least one dose of MMR. You may also need a second dose.  Pneumococcal 13-valent conjugate (PCV13) vaccine. You may need this if you have certain conditions and were not previously vaccinated.  Pneumococcal polysaccharide (PPSV23) vaccine. You may need one or two doses if you smoke cigarettes or if you have certain conditions.  Meningococcal vaccine. One dose is recommended if you are age 19-21 years and a first-year college student living in a residence hall, or if you have one of several medical conditions. You may also need additional booster doses.  Hepatitis A vaccine. You may need this if you have certain conditions or if you travel or work in places where you may be exposed to hepatitis A.  Hepatitis B vaccine. You may need this if you have certain conditions or if you travel or work in places where you may be exposed to hepatitis B.  Haemophilus influenzae type b (Hib) vaccine. You may need this if   you have certain risk factors. Talk to your health care provider about which screenings and vaccines you need and how often you need them. This information is not intended to replace advice given to you by your health care provider. Make sure you discuss any questions you have with your health care provider. Document Released: 06/14/2001 Document Revised:  11/29/2016 Document Reviewed: 02/17/2015 Elsevier Interactive Patient Education  2019 Elsevier Inc.  Vitamin D Deficiency Vitamin D deficiency is when your body does not have enough vitamin D. Vitamin D is important to your body for many reasons:  It helps the body to absorb two important minerals, called calcium and phosphorus.  It plays a role in bone health.  It may help to prevent some diseases, such as diabetes and multiple sclerosis.  It plays a role in muscle function, including heart function. You can get vitamin D by:  Eating foods that naturally contain vitamin D.  Eating or drinking milk or other dairy products that have vitamin D added to them.  Taking a vitamin D supplement or a multivitamin supplement that contains vitamin D.  Being in the sun. Your body naturally makes vitamin D when your skin is exposed to sunlight. Your body changes the sunlight into a form of the vitamin that the body can use. If vitamin D deficiency is severe, it can cause a condition in which your bones become soft. In adults, this condition is called osteomalacia. In children, this condition is called rickets. What are the causes? Vitamin D deficiency may be caused by:  Not eating enough foods that contain vitamin D.  Not getting enough sun exposure.  Having certain digestive system diseases that make it difficult for your body to absorb vitamin D. These diseases include Crohn disease, chronic pancreatitis, and cystic fibrosis.  Having a surgery in which a part of the stomach or a part of the small intestine is removed.  Being obese.  Having chronic kidney disease or liver disease. What increases the risk? This condition is more likely to develop in:  Older people.  People who do not spend much time outdoors.  People who live in a long-term care facility.  People who have had broken bones.  People with weak or thin bones (osteoporosis).  People who have a disease or condition  that changes how the body absorbs vitamin D.  People who have dark skin.  People who take certain medicines, such as steroid medicines or certain seizure medicines.  People who are overweight or obese. What are the signs or symptoms? In mild cases of vitamin D deficiency, there may not be any symptoms. If the condition is severe, symptoms may include:  Bone pain.  Muscle pain.  Falling often.  Broken bones caused by a minor injury. How is this diagnosed? This condition is usually diagnosed with a blood test. How is this treated? Treatment for this condition may depend on what caused the condition. Treatment options include:  Taking vitamin D supplements.  Taking a calcium supplement. Your health care provider will suggest what dose is best for you. Follow these instructions at home:  Take medicines and supplements only as told by your health care provider.  Eat foods that contain vitamin D. Choices include: ? Fortified dairy products, cereals, or juices. Fortified means that vitamin D has been added to the food. Check the label on the package to be sure. ? Fatty fish, such as salmon or trout. ? Eggs. ? Oysters.  Do not use a tanning bed.    Maintain a healthy weight. Lose weight, if needed.  Keep all follow-up visits as told by your health care provider. This is important. Contact a health care provider if:  Your symptoms do not go away.  You feel like throwing up (nausea) or you throw up (vomit).  You have fewer bowel movements than usual or it is difficult for you to have a bowel movement (constipation). This information is not intended to replace advice given to you by your health care provider. Make sure you discuss any questions you have with your health care provider. Document Released: 07/11/2011 Document Revised: 09/30/2015 Document Reviewed: 09/03/2014 Elsevier Interactive Patient Education  2019 Elsevier Inc.  Cholesterol Cholesterol is a white, waxy,  fat-like substance that is needed by the human body in small amounts. The liver makes all the cholesterol we need. Cholesterol is carried from the liver by the blood through the blood vessels. Deposits of cholesterol (plaques) may build up on blood vessel (artery) walls. Plaques make the arteries narrower and stiffer. Cholesterol plaques increase the risk for heart attack and stroke. You cannot feel your cholesterol level even if it is very high. The only way to know that it is high is to have a blood test. Once you know your cholesterol levels, you should keep a record of the test results. Work with your health care provider to keep your levels in the desired range. What do the results mean?  Total cholesterol is a rough measure of all the cholesterol in your blood.  LDL (low-density lipoprotein) is the "bad" cholesterol. This is the type that causes plaque to build up on the artery walls. You want this level to be low.  HDL (high-density lipoprotein) is the "good" cholesterol because it cleans the arteries and carries the LDL away. You want this level to be high.  Triglycerides are fat that the body can either burn for energy or store. High levels are closely linked to heart disease. What are the desired levels of cholesterol?  Total cholesterol below 200.  LDL below 100 for people who are at risk, below 70 for people at very high risk.  HDL above 40 is good. A level of 60 or higher is considered to be protective against heart disease.  Triglycerides below 150. How can I lower my cholesterol? Diet Follow your diet program as told by your health care provider.  Choose fish or white meat chicken and [place], roasted or baked. Limit fatty cuts of red meat, fried foods, and processed meats, such as sausage and lunch meats.  Eat lots of fresh fruits and vegetables.  Choose whole grains, beans, pasta, potatoes, and cereals.  Choose olive oil, corn oil, or canola oil, and use only small  amounts.  Avoid butter, mayonnaise, shortening, or palm kernel oils.  Avoid foods with trans fats.  Drink skim or nonfat milk and eat low-fat or nonfat yogurt and cheeses. Avoid whole milk, cream, ice cream, egg yolks, and full-fat cheeses.  Healthier desserts include angel food cake, ginger snaps, animal crackers, hard candy, popsicles, and low-fat or nonfat frozen yogurt. Avoid pastries, cakes, pies, and cookies.  Exercise  Follow your exercise program as told by your health care provider. A regular program: ? Helps to decrease LDL and raise HDL. ? Helps with weight control.  Do things that increase your activity level, such as gardening, walking, and taking the stairs.  Ask your health care provider about ways that you can be more active in your daily life. Medicine  Take over-the-counter and prescription medicines only as told by your health care provider. ? Medicine may be prescribed by your health care provider to help lower cholesterol and decrease the risk for heart disease. This is usually done if diet and exercise have failed to bring down cholesterol levels. ? If you have several risk factors, you may need medicine even if your levels are normal. This information is not intended to replace advice given to you by your health care provider. Make sure you discuss any questions you have with your health care provider. Document Released: 01/11/2001 Document Revised: 11/14/2015 Document Reviewed: 10/17/2015 Elsevier Interactive Patient Education  2019 [place].  Preventing Type 2 Diabetes Mellitus Type 2 diabetes (type 2 diabetes mellitus) is a long-term (chronic) disease that affects blood sugar (glucose) levels. Normally, a hormone called insulin allows glucose to enter cells in the body. The cells use glucose for energy. In type 2 diabetes, one or both of these problems may be present:  The body does not make enough insulin.  The body does not respond properly to insulin  that it makes (insulin resistance). Insulin resistance or lack of insulin causes excess glucose to build up in the blood instead of going into cells. As a result, high blood glucose (hyperglycemia) develops, which can cause many complications. Being overweight or obese and having an inactive (sedentary) lifestyle can increase your risk for diabetes. Type 2 diabetes can be delayed or prevented by making certain nutrition and lifestyle changes. What nutrition changes can be made?   Eat healthy meals and snacks regularly. Keep a healthy snack with you for when you get hungry between meals, such as fruit or a handful of nuts.  Eat lean meats and proteins that are low in saturated fats, such as chicken, fish, egg whites, and beans. Avoid processed meats.  Eat plenty of fruits and vegetables and plenty of grains that have not been processed (whole grains). It is recommended that you eat: ? 1?2 cups of fruit every day. ? 2?3 cups of vegetables every day. ? 6?8 oz of whole grains every day, such as oats, whole wheat, bulgur, brown rice, quinoa, and millet.  Eat low-fat dairy products, such as milk, yogurt, and cheese.  Eat foods that contain healthy fats, such as nuts, avocado, olive oil, and canola oil.  Drink water throughout the day. Avoid drinks that contain added sugar, such as soda or sweet tea.  Follow instructions from your health care provider about specific eating or drinking restrictions.  Control how much food you eat at a time (portion size). ? Check food labels to find out the serving sizes of foods. ? Use a kitchen scale to weigh amounts of foods.  Saute or steam food instead of frying it. Cook with water or broth instead of oils or butter.  Limit your intake of: ? Salt (sodium). Have no more than 1 tsp (2,400 mg) of sodium a day. If you have heart disease or high blood pressure, have less than ? tsp (1,500 mg) of sodium a day. ? Saturated fat. This is fat that is solid at  room temperature, such as butter or fat on meat. What lifestyle changes can be made? Activity   Do moderate-intensity physical activity for at least 30 minutes on at least 5 days of the week, or as much as told by your health care provider.  Ask your health care provider what activities are safe for you. A mix of physical activities may be best, such as  walking, swimming, cycling, and strength training.  Try to add physical activity into your day. For example: ? Park in spots that are farther away than usual, so that you walk more. For example, park in a far corner of the parking lot when you go to the office or the grocery store. ? Take a walk during your lunch break. ? Use stairs instead of elevators or escalators. Weight Loss  Lose weight as directed. Your health care provider can determine how much weight loss is best for you and can help you lose weight safely.  If you are overweight or obese, you may be instructed to lose at least 5?7 % of your body weight. Alcohol and Tobacco   Limit alcohol intake to no more than 1 drink a day for nonpregnant women and 2 drinks a day for men. One drink equals 12 oz of beer, 5 oz of wine, or 1 oz of hard liquor.  Do not use any tobacco products, such as cigarettes, chewing tobacco, and e-cigarettes. If you need help quitting, ask your health care provider. Work With [facility] Provider  Have your blood glucose tested regularly, as told by your health care provider.  Discuss your risk factors and how you can reduce your risk for diabetes.  Get screening tests as told by your health care provider. You may have screening tests regularly, especially if you have certain risk factors for type 2 diabetes.  Make an appointment with a diet and nutrition specialist (registered dietitian). A registered dietitian can help you make a healthy eating plan and can help you understand portion sizes and food labels. Why are these changes important?  It  is possible to prevent or delay type 2 diabetes and related health problems by making lifestyle and nutrition changes.  It can be difficult to recognize signs of type 2 diabetes. The best way to avoid possible damage to your body is to take actions to prevent the disease before you develop symptoms. What can happen if changes are not made?  Your blood glucose levels may keep increasing. Having high blood glucose for a long time is dangerous. Too much glucose in your blood can damage your blood vessels, heart, kidneys, nerves, and eyes.  You may develop prediabetes or type 2 diabetes. Type 2 diabetes can lead to many chronic health problems and complications, such as: ? Heart disease. ? Stroke. ? Blindness. ? Kidney disease. ? Depression. ? Poor circulation in the feet and legs, which could lead to surgical removal (amputation) in severe cases. Where to find support  Ask your health care provider to recommend a registered dietitian, diabetes educator, or weight loss program.  Look for local or online weight loss groups.  Join a gym, fitness club, or outdoor activity group, such as a walking club. Where to find more information To learn more about diabetes and diabetes prevention, visit:  American Diabetes Association (ADA): www.diabetes.[employer] of Diabetes and Digestive and Kidney Diseases: FindSpin.nl To learn more about healthy eating, visit:  The U.S. Department of Agriculture Scientist, research (physical sciences)), Choose My Plate: http://wiley-williams.com/  Office of Disease Prevention and Health Promotion (ODPHP), Dietary Guidelines: SurferLive.at Summary  You can reduce your risk for type 2 diabetes by increasing your physical activity, eating healthy foods, and losing weight as directed.  Talk with your health care provider about your risk for type 2 diabetes. Ask about any blood tests or screening tests that you need to  have. This information is not intended to  replace advice given to you by your health care provider. Make sure you discuss any questions you have with your health care provider. Document Released: 08/10/2015 Document Revised: 03/30/2017 Document Reviewed: 06/09/2015 Elsevier Interactive Patient Education  2019 [place] Sleep Information, Adult Quality sleep is important for your mental and physical health. It also improves your quality of life. Quality sleep means you:  Are asleep for most of the time you are in bed.  Fall asleep within 30 minutes.  Wake up no more than once a night.  Are awake for no longer than 20 minutes if you do wake up during the night. Most adults need 7-8 hours of quality sleep each night. How can poor sleep affect me? If you do not get enough quality sleep, you may have:  Mood swings.  Daytime sleepiness.  Confusion.  Decreased reaction time.  Sleep disorders, such as insomnia and sleep apnea.  Difficulty with: ? Solving problems. ? Coping with stress. ? Paying attention. These issues may affect your performance and productivity at work, school, and at home. Lack of sleep may also put you at higher risk for accidents, suicide, and risky behaviors. If you do not get quality sleep you may also be at higher risk for several health problems, including:  Infections.  Type 2 diabetes.  Heart disease.  High blood pressure.  Obesity.  Worsening of long-term conditions, like arthritis, kidney disease, depression, Parkinson's disease, and epilepsy. What actions can I take to get more quality sleep?      Stick to a sleep schedule. Go to sleep and wake up at about the same time each day. Do not try to sleep less on weekdays and make up for lost sleep on weekends. This does not work.  Try to get about 30 minutes of exercise on most days. Do not exercise 2-3 hours before going to bed.  Limit naps during the day to 30 minutes or  less.  Do not use any products that contain nicotine or tobacco, such as cigarettes or e-cigarettes. If you need help quitting, ask your health care provider.  Do not drink caffeinated beverages for at least 8 hours before going to bed. Coffee, tea, and some sodas contain caffeine.  Do not drink alcohol close to bedtime.  Do not eat large meals close to bedtime.  Do not take naps in the late afternoon.  Try to get at least 30 minutes of sunlight every day. Morning sunlight is best.  Make time to relax before bed. Reading, listening to music, or taking a hot bath promotes quality sleep.  Make your bedroom a place that promotes quality sleep. Keep your bedroom dark, quiet, and at a comfortable room temperature. Make sure your bed is comfortable. Take out sleep distractions like TV, a computer, smartphone, and bright lights.  If you are lying awake in bed for longer than 20 minutes, get up and do a relaxing activity until you feel sleepy.  Work with your health care provider to treat medical conditions that may affect sleeping, such as: ? Nasal obstruction. ? Snoring. ? Sleep apnea and other sleep disorders.  Talk to your health care provider if you think any of your prescription medicines may cause you to have difficulty falling or staying asleep.  If you have sleep problems, talk with a sleep consultant. If you think you have a sleep disorder, talk with your health care provider about getting evaluated by a specialist. Where to find more information  National Sleep  Foundation website: https://sleepfoundation.org  National Heart, Lung, and [place] ([place]): http://www.saunders.info/.pdf  Centers for Disease Control and Prevention (CDC): LearningDermatology.pl Contact a health care provider if you:  Have trouble getting to sleep or staying asleep.  Often wake up very early in the morning and cannot get back to sleep.  Have daytime  sleepiness.  Have daytime sleep attacks of suddenly falling asleep and sudden muscle weakness (narcolepsy).  Have a tingling sensation in your legs with a strong urge to move your legs (restless legs syndrome).  Stop breathing briefly during sleep (sleep apnea).  Think you have a sleep disorder or are taking a medicine that is affecting your quality of sleep. Summary  Most adults need 7-8 hours of quality sleep each night.  Getting enough quality sleep is an important part of health and well-being.  Make your bedroom a place that promotes quality sleep and avoid things that may cause you to have poor sleep, such as alcohol, caffeine, smoking, and large meals.  Talk to your health care provider if you have trouble falling asleep or staying asleep. This information is not intended to replace advice given to you by your health care provider. Make sure you discuss any questions you have with your health care provider. Document Released: 07/26/2017 Document Revised: 07/26/2017 Document Reviewed: 07/26/2017 Elsevier Interactive Patient Education  2019 [place].  Generalized Anxiety Disorder, Adult Generalized anxiety disorder (GAD) is a mental health disorder. People with this condition constantly worry about everyday events. Unlike normal anxiety, worry related to GAD is not triggered by a specific event. These worries also do not fade or get better with time. GAD interferes with life functions, including relationships, work, and school. GAD can vary from mild to severe. People with severe GAD can have intense waves of anxiety with physical symptoms (panic attacks). What are the causes? The exact cause of GAD is not known. What increases the risk? This condition is more likely to develop in:  Women.  People who have a family history of anxiety disorders.  People who are very shy.  People who experience very stressful life events, such as the death of a loved one.  People who  have a very stressful family environment. What are the signs or symptoms? People with GAD often worry excessively about many things in their lives, such as their health and family. They may also be overly concerned about:  Doing well at work.  Being on time.  Natural disasters.  Friendships. Physical symptoms of GAD include:  Fatigue.  Muscle tension or having muscle twitches.  Trembling or feeling shaky.  Being easily startled.  Feeling like your heart is pounding or racing.  Feeling out of breath or like you cannot take a deep breath.  Having trouble falling asleep or staying asleep.  Sweating.  Nausea, diarrhea, or irritable bowel syndrome (IBS).  Headaches.  Trouble concentrating or remembering facts.  Restlessness.  Irritability. How is this diagnosed? Your health care provider can diagnose GAD based on your symptoms and medical history. You will also have a physical exam. The health care provider will ask specific questions about your symptoms, including how severe they are, when they started, and if they come and go. Your health care provider may ask you about your use of alcohol or drugs, including prescription medicines. Your health care provider may refer you to a mental health specialist for further evaluation. Your health care provider will do a thorough examination and may perform additional tests to rule out  other possible causes of your symptoms. To be diagnosed with GAD, a person must have anxiety that:  Is out of his or her control.  Affects several different aspects of his or her life, such as work and relationships.  Causes distress that makes him or her unable to take part in normal activities.  Includes at least three physical symptoms of GAD, such as restlessness, fatigue, trouble concentrating, irritability, muscle tension, or sleep problems. Before your health care provider can confirm a diagnosis of GAD, these symptoms must be present more  days than they are not, and they must last for six months or longer. How is this treated? The following therapies are usually used to treat GAD:  Medicine. Antidepressant medicine is usually prescribed for long-term daily control. Antianxiety medicines may be added in severe cases, especially when panic attacks occur.  Talk therapy (psychotherapy). Certain types of talk therapy can be helpful in treating GAD by providing support, education, and guidance. Options include: ? Cognitive behavioral therapy (CBT). People learn coping skills and techniques to ease their anxiety. They learn to identify unrealistic or negative thoughts and behaviors and to replace them with positive ones. ? Acceptance and commitment therapy (ACT). This treatment teaches people how to be mindful as a way to cope with unwanted thoughts and feelings. ? Biofeedback. This process trains you to manage your body's response (physiological response) through breathing techniques and relaxation methods. You will work with a therapist while machines are used to monitor your physical symptoms.  Stress management techniques. These include yoga, meditation, and exercise. A mental health specialist can help determine which treatment is best for you. Some people see improvement with one type of therapy. However, other people require a combination of therapies. Follow these instructions at home:  Take over-the-counter and prescription medicines only as told by your health care provider.  Try to maintain a normal routine.  Try to anticipate stressful situations and allow extra time to manage them.  Practice any stress management or self-calming techniques as taught by your health care provider.  Do not punish yourself for setbacks or for not making progress.  Try to recognize your accomplishments, even if they are small.  Keep all follow-up visits as told by your health care provider. This is important. Contact a health care  provider if:  Your symptoms do not get better.  Your symptoms get worse.  You have signs of depression, such as: ? A persistently sad, cranky, or irritable mood. ? Loss of enjoyment in activities that used to bring you joy. ? Change in weight or eating. ? Changes in sleeping habits. ? Avoiding friends or family members. ? Loss of energy for normal tasks. ? Feelings of guilt or worthlessness. Get help right away if:  You have serious thoughts about hurting yourself or others. If you ever feel like you may hurt yourself or others, or have thoughts about taking your own life, get help right away. You can go to your nearest emergency department or call:  Your local emergency services (911 in the [place]  A suicide crisis helpline, such as the [facility] at 631-540-9184. This is open 24 hours a day. Summary  Generalized anxiety disorder (GAD) is a mental health disorder that involves worry that is not triggered by a specific event.  People with GAD often worry excessively about many things in their lives, such as their health and family.  GAD may cause physical symptoms such as restlessness, trouble concentrating, sleep problems, frequent  sweating, nausea, diarrhea, headaches, and trembling or muscle twitching.  A mental health specialist can help determine which treatment is best for you. Some people see improvement with one type of therapy. However, other people require a combination of therapies. This information is not intended to replace advice given to you by your health care provider. Make sure you discuss any questions you have with your health care provider. Document Released: 08/13/2012 Document Revised: 03/08/2016 Document Reviewed: 03/08/2016 Elsevier Interactive Patient Education  2019 [employer].

## 2018-07-11 ENCOUNTER — Other Ambulatory Visit: Payer: Self-pay | Admitting: Obstetrics and Gynecology

## 2018-07-11 LAB — LIPID PANEL
CHOLESTEROL TOTAL: 242 mg/dL — AB (ref 100–199)
Chol/HDL Ratio: 4 ratio (ref 0.0–4.4)
HDL: 60 mg/dL (ref >39)
LDL CALC: 170 mg/dL — AB (ref 0–99)
TRIGLYCERIDES: 60 mg/dL (ref 0–149)
VLDL Cholesterol Cal: 12 mg/dL (ref 5–40)

## 2018-07-11 LAB — COMPREHENSIVE METABOLIC PANEL
ALBUMIN: 4.2 g/dL (ref 3.8–4.8)
ALK PHOS: 68 IU/L (ref 39–117)
ALT: 12 IU/L (ref 0–32)
AST: 13 IU/L (ref 0–40)
Albumin/Globulin Ratio: 1.7 (ref 1.2–2.2)
BUN / CREAT RATIO: 9 (ref 9–23)
BUN: 10 mg/dL (ref 6–20)
Bilirubin Total: 0.2 mg/dL (ref 0.0–1.2)
CO2: 24 mmol/L (ref 20–29)
CREATININE: 1.08 mg/dL — AB (ref 0.57–1.00)
Calcium: 9.2 mg/dL (ref 8.7–10.2)
Chloride: 101 mmol/L (ref 96–106)
GFR, EST AFRICAN AMERICAN: 78 mL/min/{1.73_m2} (ref >59)
GFR, EST NON AFRICAN AMERICAN: 68 mL/min/{1.73_m2} (ref >59)
GLOBULIN, TOTAL: 2.5 g/dL (ref 1.5–4.5)
Glucose: 90 mg/dL (ref 65–99)
Potassium: 4.5 mmol/L (ref 3.5–5.2)
SODIUM: 138 mmol/L (ref 134–144)
Total Protein: 6.7 g/dL (ref 6.0–8.5)

## 2018-07-11 LAB — VITAMIN D 25 HYDROXY (VIT D DEFICIENCY, FRACTURES): Vit D, 25-Hydroxy: 25.9 ng/mL — ABNORMAL LOW (ref 30.0–100.0)

## 2018-07-11 LAB — HEMOGLOBIN A1C
ESTIMATED AVERAGE GLUCOSE: 114 mg/dL
Hgb A1c MFr Bld: 5.6 % (ref 4.8–5.6)

## 2018-07-11 LAB — TSH: TSH: 1.48 u[IU]/mL (ref 0.450–4.500)

## 2018-07-11 MED ORDER — VITAMIN D (ERGOCALCIFEROL) 1.25 MG (50000 UNIT) PO CAPS: 50000.0000 [IU] | ORAL_CAPSULE | ORAL | 1 refills | Status: DC

## 2018-07-19 ENCOUNTER — Ambulatory Visit: Payer: Self-pay

## 2018-08-01 ENCOUNTER — Telehealth: Payer: Self-pay

## 2018-08-01 NOTE — Telephone Encounter (Signed)
Coronavirus (COVID-19) Are you at risk?  Are you at risk for the Coronavirus (COVID-19)?  To be considered HIGH RISK for Coronavirus (COVID-19), you have to meet the following criteria:  . Traveled to [place], [place], [place], [place] or [place]; or in the [place] to [place], [place], [place], or [place]; and have fever, cough, and shortness of breath within the last 2 weeks of travel OR . Been in close contact with a person diagnosed with COVID-19 within the last 2 weeks and have fever, cough, and shortness of breath . IF YOU DO NOT MEET THESE CRITERIA, YOU ARE CONSIDERED LOW RISK FOR COVID-19.  What to do if you are HIGH RISK for COVID-19?  . If you are having a medical emergency, call 911. . Seek medical care right away. Before you go to a doctor's office, urgent care or emergency department, call ahead and tell them about your recent travel, contact with someone diagnosed with COVID-19, and your symptoms. You should receive instructions from your physician's office regarding next steps of care.  . When you arrive at healthcare provider, tell the healthcare staff immediately you have returned from visiting [place], [place], [place], [place] or [place]; or traveled in the [place] to [place], [place], [place], or [place]; in the last two weeks or you have been in close contact with a person diagnosed with COVID-19 in the last 2 weeks.   . Tell the health care staff about your symptoms: fever, cough and shortness of breath. . After you have been seen by a medical provider, you will be either: o Tested for (COVID-19) and discharged home on quarantine except to seek medical care if symptoms worsen, and asked to  - Stay home and avoid contact with others until you get your results (4-5 days)  - Avoid travel on public transportation if possible (such as bus, train, or airplane) or o Sent to the Emergency Department by EMS for evaluation, COVID-19 testing, and possible  admission depending on your condition and test results.  What to do if you are LOW RISK for COVID-19?  Reduce your risk of any infection by using the same precautions used for avoiding the common cold or flu:  . Wash your hands often with soap and warm water for at least 20 seconds.  If soap and water are not readily available, use an alcohol-based hand sanitizer with at least 60% alcohol.  . If coughing or sneezing, cover your mouth and nose by coughing or sneezing into the elbow areas of your shirt or coat, into a tissue or into your sleeve (not your hands). . Avoid shaking hands with others and consider head nods or verbal greetings only. . Avoid touching your eyes, nose, or mouth with unwashed hands.  . Avoid close contact with people who are sick. . Avoid places or events with large numbers of people in one location, like concerts or sporting events. . Carefully consider travel plans you have or are making. . If you are planning any travel outside or inside the [place], visit the CDC's Travelers' Health webpage for the latest health notices. . If you have some symptoms but not all symptoms, continue to monitor at home and seek medical attention if your symptoms worsen. . If you are having a medical emergency, call 911.   ADDITIONAL HEALTHCARE OPTIONS FOR PATIENTS  [facility] / e-Visit: [email or web address]         [facility]: [phone]  [facility]   Urgent Care: [facility]: [phone]  Prescreened for covid- neg/ cm

## 2018-08-02 ENCOUNTER — Ambulatory Visit (INDEPENDENT_AMBULATORY_CARE_PROVIDER_SITE_OTHER): Payer: Managed Care, Other (non HMO)

## 2018-08-02 ENCOUNTER — Other Ambulatory Visit: Payer: Self-pay

## 2018-08-02 DIAGNOSIS — D259 Leiomyoma of uterus, unspecified: Secondary | ICD-10-CM

## 2018-09-04 ENCOUNTER — Other Ambulatory Visit: Payer: Self-pay | Admitting: *Deleted

## 2018-09-04 MED ORDER — BUPROPION HCL ER (XL) 150 MG PO TB24: 150.0000 mg | ORAL_TABLET | Freq: Every day | ORAL | 6 refills | Status: AC

## 2018-09-17 ENCOUNTER — Encounter: Payer: Managed Care, Other (non HMO) | Admitting: Adult Health

## 2018-10-15 ENCOUNTER — Telehealth: Payer: Self-pay

## 2018-10-15 NOTE — Telephone Encounter (Signed)
Coronavirus (COVID-19) Are you at risk?  Are you at risk for the Coronavirus (COVID-19)?  To be considered HIGH RISK for Coronavirus (COVID-19), you have to meet the following criteria:  . Traveled to [place], [place], [place], [place] or [place]; or in the [place] to [place], [place], [place], or [place]; and have fever, cough, and shortness of breath within the last 2 weeks of travel OR . Been in close contact with a person diagnosed with COVID-19 within the last 2 weeks and have fever, cough, and shortness of breath . IF YOU DO NOT MEET THESE CRITERIA, YOU ARE CONSIDERED LOW RISK FOR COVID-19.  What to do if you are HIGH RISK for COVID-19?  Marland Kitchen If you are having a medical emergency, call 911. . Seek medical care right away. Before you go to a doctor's office, urgent care or emergency department, call ahead and tell them about your recent travel, contact with someone diagnosed with COVID-19, and your symptoms. You should receive instructions from your physician's office regarding next steps of care.  . When you arrive at healthcare provider, tell the healthcare staff immediately you have returned from visiting [place], [place], [place], [place] or [place]; or traveled in the [place] to [place], [place], [place], or [place]; in the last two weeks or you have been in close contact with a person diagnosed with COVID-19 in the last 2 weeks.   . Tell the health care staff about your symptoms: fever, cough and shortness of breath. . After you have been seen by a medical provider, you will be either: o Tested for (COVID-19) and discharged home on quarantine except to seek medical care if symptoms worsen, and asked to  - Stay home and avoid contact with others until you get your results (4-5 days)  - Avoid travel on public transportation if possible (such as bus, train, or airplane) or o Sent to the Emergency Department by EMS for evaluation, COVID-19 testing, and possible  admission depending on your condition and test results.  What to do if you are LOW RISK for COVID-19?  Reduce your risk of any infection by using the same precautions used for avoiding the common cold or flu:  Marland Kitchen Wash your hands often with soap and warm water for at least 20 seconds.  If soap and water are not readily available, use an alcohol-based hand sanitizer with at least 60% alcohol.  . If coughing or sneezing, cover your mouth and nose by coughing or sneezing into the elbow areas of your shirt or coat, into a tissue or into your sleeve (not your hands). . Avoid shaking hands with others and consider head nods or verbal greetings only. . Avoid touching your eyes, nose, or mouth with unwashed hands.  . Avoid close contact with people who are Jacqueline Rogers. . Avoid places or events with large numbers of people in one location, like concerts or sporting events. . Carefully consider travel plans you have or are making. . If you are planning any travel outside or inside the [place], visit the CDC's Travelers' Health webpage for the latest health notices. . If you have some symptoms but not all symptoms, continue to monitor at home and seek medical attention if your symptoms worsen. . If you are having a medical emergency, call 911.  10/15/18 SCREENING NEG S[facility] ADDITIONAL HEALTHCARE OPTIONS FOR PATIENTS  [facility] / e-Visit: [email or web address]         [facility]: [phone]  [facility]: [phone]                   [facility]: [phone]  

## 2018-10-16 ENCOUNTER — Ambulatory Visit: Payer: Managed Care, Other (non HMO) | Admitting: Obstetrics and Gynecology

## 2018-10-16 ENCOUNTER — Encounter: Payer: Self-pay | Admitting: Obstetrics and Gynecology

## 2018-10-16 ENCOUNTER — Other Ambulatory Visit: Payer: Self-pay

## 2018-10-16 VITALS — BP 116/83 | HR 75 | Ht 62.0 in | Wt 174.1 lb

## 2018-10-16 DIAGNOSIS — G43019 Migraine without aura, intractable, without status migrainosus: Secondary | ICD-10-CM | POA: Diagnosis not present

## 2018-10-16 MED ORDER — GABAPENTIN 100 MG PO CAPS: 100.0000 mg | ORAL_CAPSULE | Freq: Three times a day (TID) | ORAL | 2 refills | Status: DC

## 2018-10-16 MED ORDER — PROMETHAZINE HCL 25 MG PO TABS: 25.0000 mg | ORAL_TABLET | Freq: Four times a day (QID) | ORAL | 2 refills | Status: DC | PRN

## 2018-10-16 MED ORDER — BACLOFEN 10 MG PO TABS: 10.0000 mg | ORAL_TABLET | Freq: Three times a day (TID) | ORAL | 2 refills | Status: AC

## 2018-10-16 MED ORDER — ONDANSETRON 4 MG PO TBDP: 4.0000 mg | ORAL_TABLET | Freq: Four times a day (QID) | ORAL | 0 refills | Status: DC | PRN

## 2018-10-16 NOTE — Patient Instructions (Addendum)

## 2018-10-16 NOTE — Progress Notes (Signed)
  Subjective:     Patient ID: Jacqueline Rogers, female   DOB: [birth year], 33 y.o.   MRN: [id number]  HPI H/o migraines with last one 5 years ago. Has had daily migraines for a week. Taking all the OTC meds.  Last migraine 5 years ago. Starts at left side, goes up and around head and then up through roof of mouth. Pt describes pain as throbbing, burning, stabbing pain. Pain reports being woken up by pain. Medication helps alleviate some pain, but not all. Reports nausea, no vomiting, head pulls to left and unable to bring it central. Pain is only on left side. Previously on Gabapentin and Baclofin. She has had to leave work due to headaches.  Denies auras or light sensitivity.  Desires refill on previous medications  Also has not started wellbutrin as fearful it will affect her sleep. States she was sleeping well with halcion and didn't want to change that. But headaches have now been waking her up.    Review of Systems  Gastrointestinal: Positive for nausea.  Neurological: Positive for headaches.  Psychiatric/Behavioral: Positive for sleep disturbance.  All other systems reviewed and are negative.      Objective:   Physical Exam A&Ox4 Well groomed female in mild distress Blood pressure 116/83, pulse 75, height 5\' 2"  (1.575 m), weight 174 lb 1.6 oz (79 kg), last menstrual period 10/07/2018. PEERL No swelling or muscle spasms noted on head or neck.     Assessment:     Migraine headache Sleep disturbance     Plan:     Medications refilled, encouraged starting wellbutrin when migraine resolves. Will consider. RTC as needed.   Mazie Fencl,CNM

## 2018-10-17 ENCOUNTER — Telehealth: Payer: Self-pay

## 2018-10-17 ENCOUNTER — Encounter: Payer: Self-pay | Admitting: *Deleted

## 2018-10-17 NOTE — Telephone Encounter (Signed)
Pt is requesting a return to work note sent to her my chart.  Return date 10/18/18.   Pls advise.

## 2018-10-17 NOTE — Telephone Encounter (Signed)
Done-ac 

## 2018-12-26 ENCOUNTER — Other Ambulatory Visit: Payer: Self-pay | Admitting: *Deleted

## 2018-12-26 MED ORDER — GABAPENTIN 100 MG PO CAPS: 100.0000 mg | ORAL_CAPSULE | Freq: Three times a day (TID) | ORAL | 2 refills | Status: DC

## 2019-05-22 ENCOUNTER — Encounter: Payer: Managed Care, Other (non HMO) | Admitting: Obstetrics and Gynecology

## 2019-05-22 ENCOUNTER — Other Ambulatory Visit: Payer: Self-pay

## 2019-05-22 NOTE — Progress Notes (Deleted)
Pt present for missed menses. Pt's LMP

## 2019-06-25 ENCOUNTER — Encounter: Payer: Managed Care, Other (non HMO) | Admitting: Obstetrics and Gynecology

## 2019-09-20 ENCOUNTER — Encounter: Payer: Managed Care, Other (non HMO) | Admitting: Obstetrics and Gynecology

## 2020-03-10 ENCOUNTER — Telehealth: Payer: Self-pay

## 2020-03-10 NOTE — Telephone Encounter (Signed)
Pt states she needs her Rubella and Varicella labs.   Pt had a baby in 2014. PN labs drawn at that time. Unable to access HDA- error message. IT ticket placed.   Pt request an appt for a refill of Gabapentin and baclofen due to Migraines. LV- 10/2018. Had an appt with ASC but it was cx due to [place] had to do a delivery. Pt states she needs and AE but unsure when she will be in [place].  Pt currently living in [facility]. Encouraged pt to find a PCP in [place] that can manage her migraines.   Will follow up pt once I can access HDA. Pt voices understanding.

## 2020-03-10 NOTE — Telephone Encounter (Signed)
Patient called in stating that she would like a record of her immunizations, informed patient that it looks like her MyChart is active and that she could access those records through there. Patient stated that she would rather not and would just like a record of her immunizations sent via her MyChart. Informed patient that the two providers that she has seen here is no longer here and that I could send a message to our practice administrator to see if we have records of those immunizations. Patient verbalized understanding.

## 2020-03-16 NOTE — Telephone Encounter (Signed)
Pt aware no varicella or rubella labs in HDA.

## 2020-04-10 ENCOUNTER — Telehealth: Payer: Self-pay

## 2020-04-10 NOTE — Telephone Encounter (Signed)
mychart message sent to patient

## 2020-05-13 ENCOUNTER — Telehealth: Payer: Self-pay

## 2020-05-13 NOTE — Telephone Encounter (Signed)
She can have a televisit for the appointment

## 2020-05-13 NOTE — Telephone Encounter (Signed)
Pt called in and stated that she would like a copy of her immunizations from 03/02/2012- 05/01/2013 mailed to her. The pt is requesting all sent. I filed out a verbal request over the phone with the pt and placed the request in CM's box. Pt would like a call when they are mailed. Please advise

## 2020-05-13 NOTE — Telephone Encounter (Signed)
Office note from 01/2015 mailed. NO record of tdap or flu.   Pt aware.

## 2020-05-13 NOTE — Telephone Encounter (Signed)
Pt called in and stated that she needs a refill on her Gabapentin. The pt is in [facility] and she was wanting to know if she can have a televisit for this appt. I told the pt that I can send a message and a nurse will be in touch with her. Please advise

## 2020-05-20 ENCOUNTER — Other Ambulatory Visit: Payer: Self-pay

## 2020-05-20 ENCOUNTER — Ambulatory Visit (INDEPENDENT_AMBULATORY_CARE_PROVIDER_SITE_OTHER): Payer: Federal, State, Local not specified - PPO | Admitting: Obstetrics and Gynecology

## 2020-05-20 ENCOUNTER — Encounter: Payer: Self-pay | Admitting: Obstetrics and Gynecology

## 2020-05-20 VITALS — Ht 62.0 in | Wt 178.0 lb

## 2020-05-20 DIAGNOSIS — G43019 Migraine without aura, intractable, without status migrainosus: Secondary | ICD-10-CM | POA: Diagnosis not present

## 2020-05-20 MED ORDER — ONDANSETRON 4 MG PO TBDP: 4.0000 mg | ORAL_TABLET | Freq: Four times a day (QID) | ORAL | 6 refills | Status: AC | PRN

## 2020-05-20 MED ORDER — GABAPENTIN 100 MG PO CAPS: 100.0000 mg | ORAL_CAPSULE | Freq: Three times a day (TID) | ORAL | 2 refills | Status: DC

## 2020-05-20 NOTE — Progress Notes (Signed)
Virtual Visit via Telephone Note  I connected with Jacqueline Rogers on 05/20/20 at  4:15 PM EST by telephone and verified that I am speaking with the correct person using two identifiers.  Location: Patient: Work Provider: Work (office)   I discussed the limitations, risks, security and privacy concerns of performing an evaluation and management service by telephone and the availability of in person appointments. I also discussed with the patient that there may be a patient responsible charge related to this service. The patient expressed understanding and agreed to proceed.   History of Present Illness: Jacqueline Rogers is a 35 y.o. [id number] female who presents for a medication refill.  She has a history of migraines, currently managed with a therapeutic regimen of Baclofen, Zofran, and Gabapentin (initially prescribed by Neurologist, but has been managed for the past several years by her GYN who has acted as her PCP). She reports that since being on this regimen she is down to 1-2 migraines per month. She was last seen by Lorelle Gibbs, CNM in 2020, and previously Dr. Hassell Done Defrancesco of the practice prior to his retirement..  She reports that she is currently in [place] for a job she is working on, and will be there for 1.5 years.    Observations/Objective:  Height 5\' 2"  (1.575 m), weight 178 lb (80.7 kg), last menstrual period 05/11/2020.  Gen App: no acute distress.   Assessment and Plan: - Intractable migraine without aura and without status migrainosus - will give refill of medications as requested (Gabapentin/Zofran).    Follow Up Instructions:    I discussed the assessment and treatment plan with the patient. The patient was provided an opportunity to ask questions and all were answered. The patient agreed with the plan and demonstrated an understanding of the instructions.  Encouraged scheduling appointment at her convenience for routine preventative maintenance.     The patient was advised to call back or seek an in-person evaluation if the symptoms worsen or if the condition fails to improve as anticipated.  I provided 6 minutes of non-face-to-face time during this encounter.   Rubie Maid, MD [facility]

## 2020-05-20 NOTE — Progress Notes (Signed)
Televisit-Pt stated that she needed a refill of her gabapentin and Zofran. Pt  will be in [place] for about a year and half due to work.

## 2020-12-25 ENCOUNTER — Encounter: Payer: Self-pay | Admitting: Obstetrics and Gynecology

## 2021-03-11 ENCOUNTER — Encounter: Payer: Self-pay | Admitting: Obstetrics and Gynecology

## 2021-03-12 ENCOUNTER — Other Ambulatory Visit: Payer: Self-pay

## 2021-03-12 ENCOUNTER — Other Ambulatory Visit (HOSPITAL_COMMUNITY)
Admission: RE | Admit: 2021-03-12 | Discharge: 2021-03-12 | Disposition: A | Discharge status: Home / Self Care | Payer: 59 | Source: Ambulatory Visit | Attending: Obstetrics and Gynecology | Admitting: Obstetrics and Gynecology

## 2021-03-12 ENCOUNTER — Encounter: Payer: Self-pay | Admitting: Obstetrics and Gynecology

## 2021-03-12 ENCOUNTER — Ambulatory Visit (INDEPENDENT_AMBULATORY_CARE_PROVIDER_SITE_OTHER): Payer: 59 | Admitting: Obstetrics and Gynecology

## 2021-03-12 VITALS — BP 126/85 | HR 94 | Resp 16 | Ht 62.0 in | Wt 166.0 lb

## 2021-03-12 DIAGNOSIS — G43019 Migraine without aura, intractable, without status migrainosus: Secondary | ICD-10-CM

## 2021-03-12 DIAGNOSIS — Z124 Encounter for screening for malignant neoplasm of cervix: Secondary | ICD-10-CM | POA: Insufficient documentation

## 2021-03-12 DIAGNOSIS — Z8659 Personal history of other mental and behavioral disorders: Secondary | ICD-10-CM

## 2021-03-12 DIAGNOSIS — Z23 Encounter for immunization: Secondary | ICD-10-CM

## 2021-03-12 DIAGNOSIS — Z113 Encounter for screening for infections with a predominantly sexual mode of transmission: Secondary | ICD-10-CM | POA: Diagnosis present

## 2021-03-12 DIAGNOSIS — Z01419 Encounter for gynecological examination (general) (routine) without abnormal findings: Secondary | ICD-10-CM | POA: Diagnosis present

## 2021-03-12 DIAGNOSIS — Z803 Family history of malignant neoplasm of breast: Secondary | ICD-10-CM

## 2021-03-12 NOTE — Progress Notes (Signed)
GYNECOLOGY ANNUAL PHYSICAL EXAM PROGRESS NOTE  Subjective:    Jacqueline Rogers is a 35 y.o. G64P1021 female who presents for an annual exam. She has a history of migraines. The patient has no major complaints today. Is still working in [place]. The patient is sexually active. The patient participates in regular exercise: yes. Has the patient ever been transfused or tattooed?: yes. The patient reports that there is not domestic violence in her life.    Menstrual History: Menarche age: Unknown Patient's last menstrual period was 02/19/2021 (exact date). Period Duration (Days): 5-6 Period Pattern: (!) Irregular Menstrual Flow: Moderate Menstrual Control: Tampon Menstrual Control Change Freq (Hours): 3-4 Dysmenorrhea: (!) Moderate Dysmenorrhea Symptoms: Cramping, Throbbing   Gynecologic History:  Contraception: condoms History of STI's: Denies Last Pap: 07/06/2017. Results were: normal.  Reports h/o abnormal pap smears in the past, has had a colposcopy which was normal.  Last mammogram: Not age appropriate     Upstream - 03/12/21 1552       Pregnancy Intention Screening   Does the patient want to become pregnant in the next year? No    Does the patient's partner want to become pregnant in the next year? No    Would the patient like to discuss contraceptive options today? No      Contraception Wrap Up   Current Method Female Condom    End Method Female Condom    Contraception Counseling Provided No            The pregnancy intention screening data noted above was reviewed. Potential methods of contraception were discussed. The patient elected to proceed with Female Condom.    OB History  Gravida Para Term Preterm AB Living  3 1 1  0 2 1  SAB IAB Ectopic Multiple Live Births  0 2 0 0 1    # Outcome Date GA Lbr Len/2nd Weight Sex Delivery Anes PTL Lv  3 IAB 2018          2 IAB 2017          1 Term 2014   5 lb 2.2 oz (2.331 kg) M Vag-Spont   LIV    Past Medical  History:  Diagnosis Date   Condyloma    LGSIL (low grade squamous intraepithelial dysplasia)    Migraine    TMJ (dislocation of temporomandibular joint)    Vitamin D deficiency     Past Surgical History:  Procedure Laterality Date   none      Family History  Problem Relation Age of Onset   Breast cancer Mother    Breast cancer Maternal Aunt    Diabetes Sister    Diabetes Brother    Ovarian cancer Neg Hx    Colon cancer Neg Hx    Heart disease Neg Hx     Social History   Socioeconomic History   Marital status: Single    Spouse name: Not on file   Number of children: Not on file   Years of education: Not on file   Highest education level: Not on file  Occupational History   Not on file  Tobacco Use   Smoking status: Never   Smokeless tobacco: Never  Vaping Use   Vaping Use: Never used  Substance and Sexual Activity   Alcohol use: No   Drug use: No   Sexual activity: Not Currently    Birth control/protection: Condom    Comment: plan b  Other Topics Concern   Not on file  Social History Narrative   Not on file   Social Determinants of Health   Financial Resource Strain: Not on file  Food Insecurity: Not on file  Transportation Needs: Not on file  Physical Activity: Not on file  Stress: Not on file  Social Connections: Not on file  Intimate Partner Violence: Not on file    Current Outpatient Medications on File Prior to Visit  Medication Sig Dispense Refill   baclofen (LIORESAL) 10 MG tablet Take 1 tablet (10 mg total) by mouth 3 (three) times daily. 90 each 2   buPROPion (WELLBUTRIN XL) 150 MG 24 hr tablet Take 1 tablet (150 mg total) by mouth daily. (Patient not taking: Reported on 05/20/2020) 30 tablet 6   gabapentin (NEURONTIN) 100 MG capsule Take 1 capsule (100 mg total) by mouth 3 (three) times daily. 270 capsule 2   ondansetron (ZOFRAN ODT) 4 MG disintegrating tablet Take 1 tablet (4 mg total) by mouth every 6 (six) hours as needed for nausea. 30  tablet 6   No current facility-administered medications on file prior to visit.    Allergies  Allergen Reactions   Zithromax [Azithromycin] Rash     Review of Systems Constitutional: negative for chills, fatigue, fevers and sweats. Positive for weight loss (intentional, ~ 30 lbs).  Eyes: negative for irritation, redness and visual disturbance Ears, nose, mouth, throat, and face: negative for hearing loss, nasal congestion, snoring and tinnitus Respiratory: negative for asthma, cough, sputum Cardiovascular: negative for chest pain, dyspnea, exertional chest pressure/discomfort, irregular heart beat, palpitations and syncope Gastrointestinal: negative for abdominal pain, change in bowel habits, nausea and vomiting Genitourinary: negative for abnormal menstrual periods, genital lesions, sexual problems and vaginal discharge, dysuria and urinary incontinence Integument/breast: negative for breast lump, breast tenderness and nipple discharge Hematologic/lymphatic: negative for bleeding and easy bruising Musculoskeletal:negative for back pain and muscle weakness Neurological: negative for dizziness, headaches, vertigo and weakness Endocrine: negative for diabetic symptoms including polydipsia, polyuria and skin dryness Allergic/Immunologic: negative for hay fever and urticaria      Objective:  Blood pressure 126/85, pulse 94, resp. rate 16, height 5\' 2"  (1.575 m), weight 166 lb (75.3 kg), last menstrual period 02/19/2021.  Body mass index is 30.36 kg/m.  General Appearance:    Alert, cooperative, no distress, appears stated age, mild obesity  Head:    Normocephalic, without obvious abnormality, atraumatic  Eyes:    PERRL, conjunctiva/corneas clear, EOM's intact, both eyes  Ears:    Normal external ear canals, both ears  Nose:   Nares normal, septum midline, mucosa normal, no drainage or sinus tenderness  Throat:   Lips, mucosa, and tongue normal; teeth and gums normal  Neck:   Supple,  symmetrical, trachea midline, no adenopathy; thyroid: no enlargement/tenderness/nodules; no carotid bruit or JVD  Back:     Symmetric, no curvature, ROM normal, no CVA tenderness  Lungs:     Clear to auscultation bilaterally, respirations unlabored  Chest Wall:    No tenderness or deformity   Heart:    Regular rate and rhythm, S1 and S2 normal, no murmur, rub or gallop  Breast Exam:    No tenderness, masses, or nipple abnormality. Macromastia bilaterally.  Abdomen:     Soft, non-tender, bowel sounds active all four quadrants, no masses, no organomegaly.    Genitalia:    Pelvic:external genitalia normal, vagina without lesions, discharge, or tenderness, rectovaginal septum  normal. Cervix normal in appearance, no cervical motion tenderness, no adnexal masses or tenderness.  Uterus normal size,  shape, mobile, regular contours, nontender.  Rectal:    Normal external sphincter.  No hemorrhoids appreciated. Internal exam not done.   Extremities:   Extremities normal, atraumatic, no cyanosis or edema  Pulses:   2+ and symmetric all extremities  Skin:   Skin color, texture, turgor normal, no rashes or lesions  Lymph nodes:   Cervical, supraclavicular, and axillary nodes normal  Neurologic:   CNII-XII intact, normal strength, sensation and reflexes throughout     Labs:  Lab Results  Component Value Date   WBC 10.0 09/09/2016   HGB 12.0 09/09/2016   HCT 36.9 09/09/2016   MCV 84 09/09/2016   PLT 314 09/09/2016    Lab Results  Component Value Date   CREATININE 1.08 (H) 07/10/2018   BUN 10 07/10/2018   NA 138 07/10/2018   K 4.5 07/10/2018   CL 101 07/10/2018   CO2 24 07/10/2018    Lab Results  Component Value Date   ALT 12 07/10/2018   AST 13 07/10/2018   GGT 32 09/09/2016   ALKPHOS 68 07/10/2018   BILITOT 0.2 07/10/2018    Lab Results  Component Value Date   TSH 1.480 07/10/2018     Assessment:   1. Encounter for well woman exam with routine gynecological exam   2.  Cervical cancer screening   3. Screen for STD (sexually transmitted disease)   4. Intractable migraine without aura and without status migrainosus   5. Need for immunization against influenza   6. Need for Tdap vaccination   7. Family history of breast cancer in first degree relative      Plan:  Blood tests: CBC with diff, Comprehensive metabolic panel, Lipoproteins, TSH, and Hemoglobin A1c. Breast self exam technique reviewed and patient encouraged to perform self-exam monthly. Contraception: condoms. Discussed healthy lifestyle modifications. Mammogram: To begin screening at age 17 due to family h/o breast cancer in mother at age 29 (dx at Stage IV).  Pap smear ordered. COVID vaccination status: declines Flu vaccine: given today Needs Tdap: Given today.  H/o migraines, currently managing with Baclofen and Gabapentin.  H/o depression, currently managed on Wellbutrin.  Desires STI screening (no concerns for exposure).  Has had a new partner within the past year.  Screening ordered.  Follow up in 1 year for annual exam   Rubie Maid, MD [facility]

## 2021-03-13 LAB — CBC
Hematocrit: 36 % (ref 34.0–46.6)
Hemoglobin: 11.8 g/dL (ref 11.1–15.9)
MCH: 27.4 pg (ref 26.6–33.0)
MCHC: 32.8 g/dL (ref 31.5–35.7)
MCV: 84 fL (ref 79–97)
Platelets: 274 10*3/uL (ref 150–450)
RBC: 4.3 x10E6/uL (ref 3.77–5.28)
RDW: 13.4 % (ref 11.7–15.4)
WBC: 9 10*3/uL (ref 3.4–10.8)

## 2021-03-13 LAB — RPR: RPR Ser Ql: NONREACTIVE

## 2021-03-13 LAB — LIPID PANEL
Chol/HDL Ratio: 3.9 ratio (ref 0.0–4.4)
Cholesterol, Total: 232 mg/dL — ABNORMAL HIGH (ref 100–199)
HDL: 60 mg/dL (ref >39)
LDL Chol Calc (NIH): 164 mg/dL — ABNORMAL HIGH (ref 0–99)
Triglycerides: 49 mg/dL (ref 0–149)
VLDL Cholesterol Cal: 8 mg/dL (ref 5–40)

## 2021-03-13 LAB — HEPATITIS C ANTIBODY: Hep C Virus Ab: <0.1 s/co ratio (ref 0.0–0.9)

## 2021-03-13 LAB — HEPATITIS B SURFACE ANTIBODY,QUALITATIVE: Hep B Surface Ab, Qual: NONREACTIVE

## 2021-03-13 LAB — TSH: TSH: 1.57 u[IU]/mL (ref 0.450–4.500)

## 2021-03-13 LAB — HIV ANTIBODY (ROUTINE TESTING W REFLEX): HIV Screen 4th Generation wRfx: NONREACTIVE

## 2021-03-16 LAB — CERVICOVAGINAL ANCILLARY ONLY
Chlamydia: NEGATIVE
Comment: NEGATIVE
Comment: NEGATIVE
Comment: NORMAL
Neisseria Gonorrhea: NEGATIVE
Trichomonas: NEGATIVE

## 2021-03-17 LAB — CYTOLOGY - PAP
Comment: NEGATIVE
Diagnosis: NEGATIVE
High risk HPV: NEGATIVE

## 2021-10-06 ENCOUNTER — Other Ambulatory Visit: Payer: Self-pay | Admitting: Obstetrics and Gynecology
